# Patient Record
Sex: Male | Born: 1940 | Hispanic: No | Marital: Married | Smoking: Former smoker
Health system: Southern US, Community
[De-identification: ages and names within clinical notes are randomized; demographics above are authoritative.]

## PROBLEM LIST (undated history)

## (undated) DIAGNOSIS — H3530 Unspecified macular degeneration: Secondary | ICD-10-CM

## (undated) DIAGNOSIS — K219 Gastro-esophageal reflux disease without esophagitis: Secondary | ICD-10-CM

## (undated) DIAGNOSIS — E785 Hyperlipidemia, unspecified: Secondary | ICD-10-CM

## (undated) HISTORY — DX: Hyperlipidemia, unspecified: E78.5

## (undated) HISTORY — DX: Gastro-esophageal reflux disease without esophagitis: K21.9

## (undated) HISTORY — DX: Unspecified macular degeneration: H35.30

---

## 2007-01-30 ENCOUNTER — Ambulatory Visit: Payer: Self-pay | Admitting: Ophthalmology

## 2010-01-18 ENCOUNTER — Emergency Department: Payer: Self-pay | Admitting: Unknown Physician Specialty

## 2011-02-27 DIAGNOSIS — M5430 Sciatica, unspecified side: Secondary | ICD-10-CM

## 2011-02-27 HISTORY — DX: Sciatica, unspecified side: M54.30

## 2012-05-01 ENCOUNTER — Ambulatory Visit: Payer: Self-pay | Admitting: Ophthalmology

## 2012-05-20 ENCOUNTER — Ambulatory Visit: Payer: Self-pay | Admitting: Vascular Surgery

## 2012-06-03 ENCOUNTER — Inpatient Hospital Stay: Payer: Self-pay | Admitting: Vascular Surgery

## 2012-06-03 LAB — CREATININE, SERUM
Creatinine: 1 mg/dL
EGFR (African American): >60
EGFR (Non-African Amer.): >60

## 2012-06-03 LAB — BUN: BUN: 16 mg/dL (ref 7–18)

## 2012-06-04 LAB — BASIC METABOLIC PANEL
Anion Gap: 7 (ref 7–16)
BUN: 16 mg/dL (ref 7–18)
Co2: 25 mmol/L (ref 21–32)
Creatinine: 1.15 mg/dL (ref 0.60–1.30)
EGFR (African American): >60
Glucose: 92 mg/dL (ref 65–99)
Osmolality: 278 (ref 275–301)

## 2012-06-04 LAB — CBC WITH DIFFERENTIAL/PLATELET
Basophil %: 0.5 %
Eosinophil %: 1 %
HCT: 38.8 % — ABNORMAL LOW (ref 40.0–52.0)
HGB: 13.4 g/dL (ref 13.0–18.0)
MCH: 32.3 pg (ref 26.0–34.0)
MCV: 94 fL (ref 80–100)
Monocyte #: 1.2 x10 3/mm — ABNORMAL HIGH (ref 0.2–1.0)
Monocyte %: 11.7 %
Neutrophil #: 6.5 10*3/uL (ref 1.4–6.5)
Neutrophil %: 64.2 %
RBC: 4.15 10*6/uL — ABNORMAL LOW (ref 4.40–5.90)
WBC: 10.1 10*3/uL (ref 3.8–10.6)

## 2012-06-04 LAB — APTT: Activated PTT: 28.3 secs (ref 23.6–35.9)

## 2012-06-05 HISTORY — PX: CAROTID STENT: SHX1301

## 2013-08-04 DIAGNOSIS — I6522 Occlusion and stenosis of left carotid artery: Secondary | ICD-10-CM | POA: Insufficient documentation

## 2014-09-22 NOTE — Op Note (Signed)
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   POSTOPERATIVE DIAGNOSIS: High-grade left carotid artery stenosis was amaurosis fugax.   PROCEDURES: 1. Ultrasound guidance for vascular access, right femoral artery.  2. Catheter placement into left carotid artery from right femoral approach.  3. Placement of a left carotid artery stent with use of embolic protection to the left carotid artery.  4. StarClose closure device, right femoral artery.   SURGEON: Annice Needy, MD  ANESTHESIA: Local with moderate conscious sedation.   ESTIMATED BLOOD LOSS: Approximately 50 mL.  CONTRAST USED: 85 mL Visipaque.   INDICATION FOR PROCEDURE: This is a gentleman who was referred to my office for carotid disease and visual symptoms with amaurosis fugax several weeks ago. He is brought in for treatment. His lesion is a straight noncalcified lesion which is favorable for carotid artery stenting. The distal internal carotid artery is reasonably small and may preclude shunt placement with open surgery and so stenting is preferred. Risks and benefits were discussed and informed consent was obtained.   DESCRIPTION OF PROCEDURE: The patient was brought to the vascular interventional radiology suite, groins were shaved and prepped and a sterile surgical field was created. The right femoral head was localized with fluoroscopy and the right femoral artery was visualized with ultrasound and found to widely patent. It was then accessed under direct ultrasound guidance without difficulty with a Seldinger needle. J-wire and 6 French sheath were placed. The patient was systemically heparinized with a total of 8000 units of intravenous heparin, after he was originally subtherapeutic with 5000 units of intravenous heparin. Pigtail catheter was placed in the aorta at the ascending  aorta and LAO projection aortogram was performed. This showed normal origins of the great vessels. I then cannulated the left common carotid artery with a JB-2 catheter without difficulty and advanced into the common carotid artery. Selective imaging was then performed. This demonstrated an approximately 95% stenosis of the left internal carotid artery about 2.5 cm beyond the origin of the internal carotid artery. There was an ulcer with a more mild stenosis at the bifurcation itself. I desired to cover both of these lesions. I advanced a JB-2 catheter into the external carotid artery, placed an Amplatz super stiff wire and then placed a shuttle sheath. Intracerebral imaging showed no anterior cerebral artery and very poor flow through the middle cerebral artery on initial imaging. I was able to cross the lesion without difficulty with a NAV-6 embolic protection device and parked this in the distal main internal carotid artery.  A 9 x 7 x 4 centimeter long tapered stent was brought onto the field and encompassed the lesion. This did not entirely cover the proximal portion of the lesion, although it covered the most significant area of stenosis well and so a 9 mm diameter x 2 centimeter in length extension stent was placed proximally, into the common carotid artery. There was still a waste so a 5 mm balloon was used to iron this out and treated this with atropine and no significant hypotension or bradycardia was seen. Completion angiogram now showed this area to be widely patent with maybe a 10 to 15% residual waste present at worse. There was excellent flow through the stent. There was markedly improved flow through the middle cerebral artery, although there was still no significant anterior cerebral filling on our images. The sheath  was then removed and a StarClose closure device deployed in the usual fashion with excellent hemostatic result. The patient tolerated the procedure well and was taken to the recovery  room in stable condition. ____________________________ Annice NeedyJason S. Merilyn Pagan, MD jsd:sb D: 06/03/2012 09:53:09 ET T: 06/03/2012 10:47:08 ET JOB#: [id number]  cc: Annice NeedyJason S. Gregoire Bennis, MD, <Dictator> Annice NeedyJASON S Krishna Dancel MD ELECTRONICALLY SIGNED 06/03/2012 12:33

## 2016-11-23 DIAGNOSIS — Z961 Presence of intraocular lens: Secondary | ICD-10-CM | POA: Insufficient documentation

## 2016-11-23 DIAGNOSIS — H353131 Nonexudative age-related macular degeneration, bilateral, early dry stage: Secondary | ICD-10-CM | POA: Insufficient documentation

## 2018-03-04 ENCOUNTER — Ambulatory Visit: Payer: Medicare Other | Admitting: Family Medicine

## 2018-03-04 ENCOUNTER — Encounter: Payer: Self-pay | Admitting: Family Medicine

## 2018-03-04 VITALS — BP 130/82 | HR 90 | Temp 98.3°F | Ht 67.0 in | Wt 166.2 lb

## 2018-03-04 DIAGNOSIS — Z23 Encounter for immunization: Secondary | ICD-10-CM

## 2018-03-04 DIAGNOSIS — I6522 Occlusion and stenosis of left carotid artery: Secondary | ICD-10-CM

## 2018-03-04 DIAGNOSIS — M129 Arthropathy, unspecified: Secondary | ICD-10-CM | POA: Insufficient documentation

## 2018-03-04 DIAGNOSIS — Z79899 Other long term (current) drug therapy: Secondary | ICD-10-CM | POA: Insufficient documentation

## 2018-03-04 DIAGNOSIS — N400 Enlarged prostate without lower urinary tract symptoms: Secondary | ICD-10-CM | POA: Insufficient documentation

## 2018-03-04 DIAGNOSIS — Z5181 Encounter for therapeutic drug level monitoring: Secondary | ICD-10-CM

## 2018-03-04 DIAGNOSIS — H353131 Nonexudative age-related macular degeneration, bilateral, early dry stage: Secondary | ICD-10-CM

## 2018-03-04 DIAGNOSIS — E785 Hyperlipidemia, unspecified: Secondary | ICD-10-CM | POA: Insufficient documentation

## 2018-03-04 NOTE — Assessment & Plan Note (Signed)
Check CBC, cmp 

## 2018-03-04 NOTE — Patient Instructions (Signed)
Let's get labs today If you have not heard anything from my staff in a week about any orders/referrals/studies from today, please contact us here to follow-up [phone] Try to limit saturated fats in your diet (bologna, hot dogs, barbeque, cheeseburgers, hamburgers, steak, bacon, sausage, cheese, etc.) and get more fresh fruits, vegetables, and whole grains

## 2018-03-04 NOTE — Assessment & Plan Note (Signed)
Managed by ophthalmologist 

## 2018-03-04 NOTE — Progress Notes (Signed)
BP 130/82   Pulse 90   Temp 98.3 F (36.8 C)   Ht 5\' 7"  (1.702 m)   Wt 166 lb 3.2 oz (75.4 kg)   SpO2 99%   BMI 26.03 kg/m    Subjective:    Patient ID: Zachary Walker, male    DOB: August 03, 1940, 78 y.o.   MRN: [id number]  HPI: Zachary Walker is a 77 y.o. male  Chief Complaint  Patient presents with  . New Patient (Initial Visit)    HPI He is here to establish care No medical excitement recently; was with Dr. Peggye Walker sporadically, moved from office to office  He was watcing TV one night and he just went blind; he walked into his bathroom and went down on his knees and then raised up and his vision came back; he had plaque that popped off; went to his eye instead of his brain Last scan of the carotid was 2-3 years ago; he used to be on Plavix and some other and then his doctor told him he looked good, "I wouldn't take that"; he has not taken any for years Exercise 3 days a week No known family hx of carotid artery disease or high cholesterol; he does recall one brother who had a single bypass and one triple bypass in his heart; "that was his fault"; he eats sausage biscuits  High cholesterol; was checked a few years ago; he is a good eater; not much cheese; few eggs; not taking a statin  Dry macular degeneration; seeing doctor at [facility]  Would like to avoid any medicines if possible, willing to take if needed though  Depression screen Zachary Walker 2/9 03/04/2018 03/04/2018  Decreased Interest 0 0  Down, Depressed, Hopeless 0 0  PHQ - 2 Score 0 0  Altered sleeping 0 -  Tired, decreased energy 0 -  Change in appetite 0 -  Feeling bad or failure about yourself  0 -  Trouble concentrating 0 -  Moving slowly or fidgety/restless 0 -  Suicidal thoughts 0 -  PHQ-9 Score 0 -   Fall Risk  03/04/2018  Falls in the past year? No    Relevant past medical, surgical, family and social history reviewed History reviewed. No pertinent past medical history.   Past Surgical History:    Procedure Laterality Date  . CAROTID STENT Left 2014   Family History  Problem Relation Age of Onset  . Macular degeneration Mother   . Emphysema Mother   . Heart disease Brother    Social History   Tobacco Use  . Smoking status: Former Smoker    Types: Cigarettes    Last attempt to quit: 1978    Years since quitting: 41.7  . Smokeless tobacco: Former Neurosurgeon    Types: Chew    Quit date: 80  . Tobacco comment: un aware of how many or how long he smoked  Substance Use Topics  . Alcohol use: Yes    Alcohol/week: 1.0 standard drinks    Types: 1 Cans of beer per week  . Drug use: Never     Office Visit from 03/04/2018 in [facility]  AUDIT-C Score  1      Interim medical history since last visit reviewed. Allergies and medications reviewed  Review of Systems Per HPI unless specifically indicated above     Objective:    BP 130/82   Pulse 90   Temp 98.3 F (36.8 C)   Ht 5\' 7"  (1.702 m)  Wt 166 lb 3.2 oz (75.4 kg)   SpO2 99%   BMI 26.03 kg/m   Wt Readings from Last 3 Encounters:  03/04/18 166 lb 3.2 oz (75.4 kg)    Physical Exam  Constitutional: He appears well-developed and well-nourished. No distress.  HENT:  Head: Normocephalic and atraumatic.  Eyes: EOM are normal. No scleral icterus.  Neck: Carotid bruit is not present. No thyromegaly present.  Cardiovascular: Normal rate and regular rhythm.  Pulmonary/Chest: Effort normal and breath sounds normal.  Abdominal: Soft. Bowel sounds are normal. He exhibits no distension.  Musculoskeletal: He exhibits no edema.  Neurological: Coordination normal.  Skin: Skin is warm and dry. No pallor.  Psychiatric: He has a normal mood and affect. His behavior is normal. Judgment and thought content normal.    No results found for this or any previous visit.    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    Discussed PSA, he would like to check this      Relevant Orders   PSA     Other   Nonexudative age-related macular degeneration, bilateral, early dry stage    Managed by ophthalmologist      Medication monitoring encounter    Check CBC, cmp      Relevant Orders   CBC with Differential/Platelet   COMPLETE METABOLIC PANEL WITH GFR    Other Visit Diagnoses    Need for influenza vaccination       Relevant Orders   Flu vaccine HIGH DOSE PF (Fluzone High dose) (Completed)   Need for pneumococcal vaccination       Relevant Orders   Pneumococcal polysaccharide vaccine 23-valent greater than or equal to 2yo subcutaneous/IM (Completed)       Follow up plan: Return in about 1 year (around 03/05/2019) for follow-up visit with Zachary Walker; Medicare visit in the next few months with Zachary Walker.  An after-visit summary was printed and given to the patient at check-out.  Please see the patient instructions which may contain other information and recommendations beyond what is mentioned above in the assessment and plan.  No orders of the defined types were placed in this encounter.   Orders Placed This Encounter  Procedures  . Flu vaccine HIGH DOSE PF (Fluzone High dose)  . Pneumococcal polysaccharide vaccine 23-valent greater than or equal to 2yo subcutaneous/IM  . CBC with Differential/Platelet  . COMPLETE METABOLIC PANEL WITH GFR  . Lipid panel  . PSA

## 2018-03-04 NOTE — Assessment & Plan Note (Signed)
Order carotid US; check lipid panel

## 2018-03-04 NOTE — Assessment & Plan Note (Signed)
Discussed PSA, he would like to check this

## 2018-03-05 LAB — CBC WITH DIFFERENTIAL/PLATELET
BASOS PCT: 0.7 %
Basophils Absolute: 42 cells/uL (ref 0–200)
Eosinophils Absolute: 30 cells/uL (ref 15–500)
Eosinophils Relative: 0.5 %
HEMATOCRIT: 41.5 % (ref 38.5–50.0)
HEMOGLOBIN: 14.3 g/dL (ref 13.2–17.1)
LYMPHS ABS: 1320 {cells}/uL (ref 850–3900)
MCH: 32.4 pg (ref 27.0–33.0)
MCHC: 34.5 g/dL (ref 32.0–36.0)
MCV: 93.9 fL (ref 80.0–100.0)
MPV: 9.5 fL (ref 7.5–12.5)
Monocytes Relative: 8.4 %
NEUTROS ABS: 4104 {cells}/uL (ref 1500–7800)
Neutrophils Relative %: 68.4 %
Platelets: 226 10*3/uL (ref 140–400)
RBC: 4.42 10*6/uL (ref 4.20–5.80)
RDW: 11.6 % (ref 11.0–15.0)
TOTAL LYMPHOCYTE: 22 %
WBC: 6 10*3/uL (ref 3.8–10.8)
WBCMIX: 504 {cells}/uL (ref 200–950)

## 2018-03-05 LAB — COMPLETE METABOLIC PANEL WITH GFR
AG Ratio: 1.5 (calc) (ref 1.0–2.5)
ALBUMIN MSPROF: 4.3 g/dL (ref 3.6–5.1)
ALT: 7 U/L — ABNORMAL LOW (ref 9–46)
AST: 16 U/L (ref 10–35)
Alkaline phosphatase (APISO): 70 U/L (ref 40–115)
BILIRUBIN TOTAL: 0.6 mg/dL (ref 0.2–1.2)
BUN: 19 mg/dL (ref 7–25)
CALCIUM: 8.9 mg/dL (ref 8.6–10.3)
CO2: 25 mmol/L (ref 20–32)
Chloride: 104 mmol/L (ref 98–110)
Creat: 1.02 mg/dL (ref 0.70–1.18)
GFR, EST AFRICAN AMERICAN: 82 mL/min/{1.73_m2} (ref >=60)
GFR, EST NON AFRICAN AMERICAN: 71 mL/min/{1.73_m2} (ref >=60)
Globulin: 2.9 g/dL (calc) (ref 1.9–3.7)
Glucose, Bld: 114 mg/dL (ref 65–139)
POTASSIUM: 4.3 mmol/L (ref 3.5–5.3)
Sodium: 139 mmol/L (ref 135–146)
TOTAL PROTEIN: 7.2 g/dL (ref 6.1–8.1)

## 2018-03-05 LAB — LIPID PANEL
Cholesterol: 200 mg/dL — ABNORMAL HIGH (ref <200)
HDL: 38 mg/dL — ABNORMAL LOW (ref >40)
LDL Cholesterol (Calc): 123 mg/dL (calc) — ABNORMAL HIGH
NON-HDL CHOLESTEROL (CALC): 162 mg/dL — AB (ref <130)
TRIGLYCERIDES: 240 mg/dL — AB (ref <150)
Total CHOL/HDL Ratio: 5.3 (calc) — ABNORMAL HIGH (ref <5.0)

## 2018-03-05 LAB — PSA: PSA: 7.6 ng/mL — AB (ref <=4.0)

## 2018-03-06 ENCOUNTER — Other Ambulatory Visit: Payer: Self-pay | Admitting: Family Medicine

## 2018-03-06 ENCOUNTER — Encounter: Payer: Self-pay | Admitting: Family Medicine

## 2018-03-06 ENCOUNTER — Telehealth: Payer: Self-pay

## 2018-03-06 DIAGNOSIS — E785 Hyperlipidemia, unspecified: Secondary | ICD-10-CM

## 2018-03-06 DIAGNOSIS — R972 Elevated prostate specific antigen [PSA]: Secondary | ICD-10-CM | POA: Insufficient documentation

## 2018-03-06 MED ORDER — ATORVASTATIN CALCIUM 10 MG PO TABS: 10.0000 mg | ORAL_TABLET | Freq: Every day | ORAL | 1 refills | Status: DC

## 2018-03-06 NOTE — Telephone Encounter (Signed)
-----   Message from Kerman Passey, MD sent at 03/06/2018  9:17 AM EDT ----- Please let the patient know: CBC is normal Renal function is not as good as when he was 77 years old, but is typical for a 77 year-old gentleman; avoid NSAIDs, stay hydrated LDL is higher than we want; I'm going to recommend cholesterol medicine to help bring that LDL down; start med, limit saturated fats, ORDER lipid panel (fasting) to be done in six weeks, dx: hyperlipidemia PSA is elevated, so we'll recommend he get in to see urologist; please REFER for elevated PSA

## 2018-03-06 NOTE — Progress Notes (Signed)
Start statin Staff to order lipids for 6 weeks out elev psa Start to refer to urologist

## 2018-04-05 ENCOUNTER — Ambulatory Visit: Payer: Medicare Other | Admitting: Urology

## 2018-04-05 ENCOUNTER — Encounter: Payer: Self-pay | Admitting: Urology

## 2018-04-05 VITALS — BP 137/68 | HR 86 | Ht 67.5 in | Wt 168.0 lb

## 2018-04-05 DIAGNOSIS — N400 Enlarged prostate without lower urinary tract symptoms: Secondary | ICD-10-CM | POA: Diagnosis not present

## 2018-04-05 DIAGNOSIS — R972 Elevated prostate specific antigen [PSA]: Secondary | ICD-10-CM

## 2018-04-05 NOTE — Progress Notes (Signed)
04/05/2018 9:04 AM   Zachary Walker 1940/09/20 [id number]  Referring provider: Kerman Passey, MD [address] [place], [place] [address]  Chief Complaint  Patient presents with  . Elevated PSA    HPI: 77 year old male referred for evaluation of an elevated PSA.  A PSA drawn in September 2019 was elevated at 7.6.  He denies bothersome lower urinary tract symptoms.  He has nocturia x2 and occasional intermittent urinary stream.  He does have a history of an elevated PSA with his prior PCP.  He was evaluated at [facility] in [place] and elected not to pursue prostate biopsy.  Available PSA results are as follows: 07/2012  7.83 07/2013  5.27 08/2014  6.33 02/2015  6.68 02/2018  7.6   PMH: Past Medical History:  Diagnosis Date  . Hyperlipidemia     Surgical History: Past Surgical History:  Procedure Laterality Date  . CAROTID STENT Left 2014    Home Medications:  Allergies as of 04/05/2018   No Known Allergies     Medication List        Accurate as of 04/05/18  9:04 AM. Always use your most recent med list.          acetaminophen 650 MG CR tablet Commonly known as:  TYLENOL Take 1,300 mg by mouth every 12 (twelve) hours as needed for pain.   atorvastatin 10 MG tablet Commonly known as:  LIPITOR Take 1 tablet (10 mg total) by mouth at bedtime.   multivitamin tablet Take 1 tablet by mouth daily.   PRESERVISION AREDS Caps One by mouth twice a day       Allergies: No Known Allergies  Family History: Family History  Problem Relation Age of Onset  . Macular degeneration Mother   . Emphysema Mother   . Heart disease Brother     Social History:  reports that he quit smoking about 41 years ago. His smoking use included cigarettes. He quit smokeless tobacco use about 41 years ago.  His smokeless tobacco use included chew. He reports that he drinks about 1.0 standard drinks of alcohol per week. He reports that he does not use  drugs.  ROS: UROLOGY Frequent Urination?: No Hard to postpone urination?: No Burning/pain with urination?: No Get up at night to urinate?: Yes Leakage of urine?: No Urine stream starts and stops?: Yes Trouble starting stream?: No Do you have to strain to urinate?: No Blood in urine?: No Urinary tract infection?: No Sexually transmitted disease?: No Injury to kidneys or bladder?: No Painful intercourse?: No Weak stream?: No Erection problems?: No Penile pain?: No  Gastrointestinal Nausea?: No Vomiting?: No Indigestion/heartburn?: No Diarrhea?: No Constipation?: No  Constitutional Fever: No Night sweats?: No Weight loss?: No Fatigue?: No  Skin Skin rash/lesions?: No Itching?: No  Eyes Blurred vision?: No Double vision?: No  Ears/Nose/Throat Sore throat?: No Sinus problems?: No  Hematologic/Lymphatic Swollen glands?: No Easy bruising?: No  Cardiovascular Leg swelling?: No Chest pain?: No  Respiratory Cough?: No Shortness of breath?: No  Endocrine Excessive thirst?: No  Musculoskeletal Back pain?: Yes Joint pain?: No  Neurological Headaches?: No Dizziness?: No  Psychologic Depression?: No Anxiety?: No  Physical Exam: BP 137/68 (BP Location: Left Arm, Patient Position: Sitting, Cuff Size: Normal)   Pulse 86   Ht 5' 7.5" (1.715 m)   Wt 168 lb (76.2 kg)   BMI 25.92 kg/m   Constitutional:  Alert and oriented, No acute distress. HEENT: [place] AT, moist mucus membranes.  Trachea  midline, no masses. Cardiovascular: No clubbing, cyanosis, or edema. Respiratory: Normal respiratory effort, no increased work of breathing. GI: Abdomen is soft, nontender, nondistended, no abdominal masses GU: No CVA tenderness.  Prostate 45-50 g, smooth without nodules Lymph: No cervical or inguinal lymphadenopathy. Skin: No rashes, bruises or suspicious lesions. Neurologic: Grossly intact, no focal deficits, moving all 4 extremities. Psychiatric: Normal mood and  affect.   Assessment & Plan:   Although PSA is a prostate cancer screening test he was informed that cancer is not the most common cause of an elevated PSA. Other potential causes including BPH and inflammation were discussed. He was informed that the only way to adequately diagnose prostate cancer would be a transrectal ultrasound and biopsy of the prostate. The procedure was discussed including potential risks of bleeding and infection/sepsis. He was also informed that a negative biopsy does not conclusively rule out the possibility that prostate cancer may be present and that continued monitoring is required. The use of newer adjunctive blood tests including PHI and 4kScore were discussed.  The American Urological Association does not recommend PSA/prostate cancer screening for patients greater than age 17 and we discussed discontinuing prostate cancer screening.  His PSA has been elevated since at least 2014 and is currently stable.  The option of continued periodic monitoring was also discussed.  He would like to continue periodic monitoring and would prefer to have his PSA checked next year by Dr. Sherie Don. Follow-up as needed for any significant PSA change.    Riki Altes, MD  [facility] [address], [address] [place], [place] [address] [phone]

## 2018-04-28 ENCOUNTER — Other Ambulatory Visit: Payer: Self-pay | Admitting: Family Medicine

## 2018-04-29 NOTE — Telephone Encounter (Signed)
Please remind patient to have labs done ASAP He should not be out of medicine yet, so we'll want to see if we refill this same strength (10 mg) or if we need to increase it Thank you

## 2018-04-29 NOTE — Telephone Encounter (Signed)
Pt.notified

## 2018-05-07 ENCOUNTER — Other Ambulatory Visit: Payer: Self-pay | Admitting: Family Medicine

## 2018-05-07 ENCOUNTER — Telehealth: Payer: Self-pay

## 2018-05-07 DIAGNOSIS — E785 Hyperlipidemia, unspecified: Secondary | ICD-10-CM

## 2018-05-07 LAB — LIPID PANEL
CHOLESTEROL: 147 mg/dL (ref <200)
HDL: 38 mg/dL — ABNORMAL LOW (ref >40)
LDL CHOLESTEROL (CALC): 77 mg/dL
Non-HDL Cholesterol (Calc): 109 mg/dL (calc) (ref <130)
Total CHOL/HDL Ratio: 3.9 (calc) (ref <5.0)
Triglycerides: 232 mg/dL — ABNORMAL HIGH (ref <150)

## 2018-05-07 MED ORDER — ATORVASTATIN CALCIUM 20 MG PO TABS: 20.0000 mg | ORAL_TABLET | Freq: Every day | ORAL | 1 refills | Status: DC

## 2018-05-07 NOTE — Telephone Encounter (Signed)
Zachary BradfordKimberly, please let the patient know that his cholesterol has really improved His total cholesterol dropped from 200 to 147 and his LDL cholesterol (the "bad" cholesterol) came down from 123 to 77; that's great progress! We just have a little further to go to get him to goal; let's increase his atorvastatin from 10 mg to 20 mg daily and recheck his fasting lipids in 6-8 weeks (please ORDER, lipids, dx carotid athero, hyperlipidemia) Thank you

## 2018-05-07 NOTE — Progress Notes (Signed)
Increase statin

## 2018-05-07 NOTE — Telephone Encounter (Signed)
-----   Message from Kerman PasseyMelinda P Lada, MD sent at 05/07/2018  7:46 AM EST ----- Cala BradfordKimberly, please let the patient know that his cholesterol has really improved His total cholesterol dropped from 200 to 147 and his LDL cholesterol (the "bad" cholesterol) came down from 123 to 77; that's great progress! We just have a little further to go to get him to goal; let's increase his atorvastatin from 10 mg to 20 mg daily and recheck his fasting lipids in 6-8 weeks (please ORDER, lipids, dx carotid athero, hyperlipidemia) Thank you

## 2018-05-21 ENCOUNTER — Other Ambulatory Visit: Payer: Self-pay | Admitting: Family Medicine

## 2018-05-22 NOTE — Telephone Encounter (Signed)
I just approved 30+1 more than 2 weeks ago, which should last until he gets his labs done (6-8 weeks after starting new dose) Please resolve with pharmacy

## 2018-05-23 ENCOUNTER — Other Ambulatory Visit: Payer: Self-pay | Admitting: Family Medicine

## 2018-05-23 NOTE — Telephone Encounter (Signed)
Ins requesting 90 day 

## 2018-08-15 ENCOUNTER — Ambulatory Visit: Payer: Medicare Other

## 2018-08-21 ENCOUNTER — Telehealth: Payer: Self-pay | Admitting: Family Medicine

## 2018-08-21 NOTE — Telephone Encounter (Signed)
Copied from CRM [id number]. Topic: General - Other >> Aug 21, 2018  1:24 PM Tamela Oddi wrote: Reason for CRM: Patient called to leave a message for Dr. Sherie Don.  Patient would like a letter stating that he is in good health.  Patient stated that he needs this letter for a study that he is in at [facility].  Please advise and call back at [phone]

## 2018-08-22 MED ORDER — ASPIRIN EC 81 MG PO TBEC: 81.0000 mg | DELAYED_RELEASE_TABLET | Freq: Every day | ORAL | Status: AC

## 2018-08-22 NOTE — Telephone Encounter (Signed)
appt made for Monday (elizabeth first availability). I did offer emily for tomorrow but he wanted to stick with who you suggested.

## 2018-08-22 NOTE — Telephone Encounter (Signed)
Patient calling back requesting to speak with Dr. Sherie Don or CMA.

## 2018-08-22 NOTE — Telephone Encounter (Addendum)
Please let him know that I also think the world of Zachary Walker and would prefer him see first available

## 2018-08-22 NOTE — Telephone Encounter (Signed)
I called patient He's in a study at [facility], 4 weeks now for macular degeneration He thinks that he had an anxiety attack the other night Wife called rescue squad, they checked him out, feeling good He did not go to the hospital to get checked out He went to bed around 8:30 or 8:45 and couldn't get his breath; did that a few times, couldn't get his breath He panicked and got scared, got up out of bed, told his wife They just checked his vital signs, according to his vital signs, they diagnosed him with an anxiety attack It was left up to him to go to the hospital They did an EKG he says; wife says normal too they were told He has not had an anxiety attack before Wife hopped on, oxygen was "good", BP was 150/100, pulse was "good" She thinks he had some indigestion, trying to take deep breaths His color was good; no sweating Checked him out for stroke symptoms, he was okay there I asked about sleep apnea; he does snore she says; never diagnosed with sleep apnea; they don't sleep together; she is not there to know if he gasps or catches his breath in his sleep Before I can submit a letter, I think he does need to get checked out; I cannot attest that he is "healthy" and may need a sleep study; sleep apnea can be dangerous, fatal; angina is another possibility I explained this cannot wait until next month I asked about fam hx of sleep apnea or heart attacks: mother had a heart attack around age 20 or 97 Start baby 81 mg aspirin daily while working this up; he denies any contraindications If this happens again, call 911 and let them take him to the ER; he agrees Discussed safeguards to take when he comes in to the office for infection control --------------------------------------------  Cassandra,  Please call patient and get him a first available appointment to see Lanora Manis He had "ingestion, couldn't get his breath" Will want to evaluate for possible sleep apnea, cardiac issues ASAP Thank  you

## 2018-08-23 NOTE — Telephone Encounter (Signed)
We'll have to leave him on Elizabeth's schedule then for Monday, thank you

## 2018-08-23 NOTE — Telephone Encounter (Signed)
Everyone is now completely booked. Please advise

## 2018-08-25 NOTE — Progress Notes (Signed)
Name: Zachary Walker   MRN: [id number]    DOB: 1940-07-07   Date:08/26/2018       Progress Note  Subjective  Chief Complaint  Chief Complaint  Patient presents with  . GI Problem    onset last Thursday. Patient feels extremely bloated and gassy.    HPI  Patient presents for medical clearance for a investigational trial at [facility] for macular degeneration. States he has been a part of it for 3 months and has already received one dose of the medication. States he told them that he was having shortness of breath and they wanted medical clearance for continuance.  PCP called patient to discuss medical clearance and he noted he recently had to call 911 due to shortness of breath. He went to bed an woke up due to "gas pain" had lots of burping and sharp intermittent abdominal pains- sharp and lower, and then states he was unable to catch his breath. States he has been having this for the past few weeks but was most noticeable Thursday night. He believes it was an anxiety attack related to gas pains. EKG and stroke scale were normal per patient report. States he has had increase appetite the last few weeks and abdominal bloating. Denies nausea, vomiting, diarrhea, endorses mild constipation. Last BM was yesterday. Patient states took ex-lax the day before yesterday and had large stool afterwards. Has not had any shortness of breath since the, denies chest pain, fevers, chills, no blood in stools.   Results of the Epworth flowsheet 08/26/2018  Sitting and reading 1  Watching TV 1  Sitting, inactive in a public place (e.g. a theatre or a meeting) 0  As a passenger in a car for an hour without a break 0  Lying down to rest in the afternoon when circumstances permit 1  Sitting and talking to someone 0  Sitting quietly after a lunch without alcohol 0  In a car, while stopped for a few minutes in traffic 0  Total score 3     PHQ2/9: Depression screen [facility] 2/9 08/26/2018 03/04/2018 03/04/2018  Decreased  Interest 0 0 0  Down, Depressed, Hopeless 0 0 0  PHQ - 2 Score 0 0 0  Altered sleeping 0 0 -  Tired, decreased energy 0 0 -  Change in appetite 0 0 -  Feeling bad or failure about yourself  0 0 -  Trouble concentrating 0 0 -  Moving slowly or fidgety/restless 0 0 -  Suicidal thoughts 0 0 -  PHQ-9 Score 0 0 -  Difficult doing work/chores Not difficult at all - -     PHQ reviewed. Negative  Patient Active Problem List   Diagnosis Date Noted  . Hyperlipidemia LDL goal <70 03/06/2018  . Elevated PSA 03/06/2018  . Other and unspecified hyperlipidemia 03/04/2018  . 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 Arthropathy, unspecified, other specified sites 03/04/2018  . Medication monitoring encounter 03/04/2018  . Nonexudative age-related macular degeneration, bilateral, early dry stage 11/23/2016  . 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 Left carotid stenosis 08/04/2013  . Sciatica 02/27/2011    Past Medical History:  Diagnosis Date  . Hyperlipidemia     Past Surgical History:  Procedure Laterality Date  . CAROTID STENT Left 2014    Social History   Tobacco Use  . Smoking status: Former Smoker    Types: Cigarettes    Last attempt to quit: 1978    Years since quitting:  42.2  . Smokeless tobacco: Former NeurosurgeonUser    Types: Chew    Quit date: 241978  . Tobacco comment: un aware of how many or how long he smoked  Substance Use Topics  . Alcohol use: Yes    Alcohol/week: 1.0 standard drinks    Types: 1 Cans of beer per week    Comment: 5 a month     Current Outpatient Medications:  .  acetaminophen (TYLENOL) 650 MG CR tablet, Take 1,300 mg by mouth every 12 (twelve) hours as needed for pain., Disp: , Rfl:  .  aspirin EC 81 MG tablet, Take 1 tablet (81 mg total) by mouth daily., Disp: , Rfl:  .  atorvastatin (LIPITOR) 20 MG tablet, TAKE 1 TABLET BY MOUTH EVERYDAY AT BEDTIME, Disp: 90 tablet, Rfl: 0 .  Multiple Vitamin  (MULTIVITAMIN) tablet, Take 1 tablet by mouth daily., Disp: , Rfl:  .  Multiple Vitamins-Minerals (PRESERVISION AREDS) CAPS, One by mouth twice a day, Disp: , Rfl: 0  No Known Allergies  ROS   No other specific complaints in a complete review of systems (except as listed in HPI above).  Objective  Vitals:   08/26/18 0819  BP: 114/76  Pulse: 62  Resp: 14  Temp: 97.7 F (36.5 C)  SpO2: 97%  Weight: 174 lb 11.2 oz (79.2 kg)  Height: 5\' 7"  (1.702 m)    Body mass index is 27.36 kg/m.  Nursing Note and Vital Signs reviewed.  Physical Exam Vitals signs reviewed.  Constitutional:      Appearance: He is well-developed.  HENT:     Head: Normocephalic and atraumatic.     Mouth/Throat:     Mouth: Mucous membranes are moist.     Pharynx: Oropharynx is clear. No posterior oropharyngeal erythema.  Eyes:     Conjunctiva/sclera: Conjunctivae normal.  Neck:     Musculoskeletal: Normal range of motion and neck supple.     Vascular: No carotid bruit.  Cardiovascular:     Heart sounds: Normal heart sounds.  Pulmonary:     Effort: Pulmonary effort is normal.     Breath sounds: Normal breath sounds.  Abdominal:     General: Bowel sounds are increased.     Palpations: Abdomen is soft.     Tenderness: There is no abdominal tenderness. There is no right CVA tenderness or left CVA tenderness.  Musculoskeletal: Normal range of motion.  Skin:    General: Skin is warm and dry.     Capillary Refill: Capillary refill takes less than 2 seconds.  Neurological:     Mental Status: He is alert and oriented to person, place, and time.     GCS: GCS eye subscore is 4. GCS verbal subscore is 5. GCS motor subscore is 6.     Sensory: No sensory deficit.  Psychiatric:        Speech: Speech normal.        Behavior: Behavior normal.        Thought Content: Thought content normal.        Judgment: Judgment normal.      No results found for this or any previous visit (from the past 48  hour(s)).  Assessment & Plan  1. Generalized abdominal pain - EKG 12-Lead: Normal sinus rhythm, without ectopy  - CBC - COMPLETE METABOLIC PANEL WITH GFR - omeprazole (PRILOSEC) 40 MG capsule; Take 1 capsule (40 mg total) by mouth daily.  Dispense: 90 capsule; Refill: 1 - H. pylori breath test  2.  Belching Take peptobismal at night for the next week, LOW FODMAP diet, if unimproved in 1-2 weeks let us know for follow-up and change of plan, consider EGD, refer to GI - omeprazole (PRILOSEC) 40 MG capsule; Take 1 capsule (40 mg total) by mouth daily.  Dispense: 90 capsule; Refill: 1 - H. pylori breath test   -Red flags and when to present for emergency care or RTC including fever >101.61F, chest pain, shortness of breath,severe pain, difficulty breathing, fevers, vomiting/diarrhea or inability to have a bowel movement with severe pain reviewed with patient at time of visit. Follow up and care instructions discussed and provided in AVS.

## 2018-08-26 ENCOUNTER — Other Ambulatory Visit: Payer: Self-pay

## 2018-08-26 ENCOUNTER — Encounter: Payer: Self-pay | Admitting: Nurse Practitioner

## 2018-08-26 ENCOUNTER — Ambulatory Visit (INDEPENDENT_AMBULATORY_CARE_PROVIDER_SITE_OTHER): Payer: Medicare Other | Admitting: Nurse Practitioner

## 2018-08-26 ENCOUNTER — Other Ambulatory Visit: Payer: Self-pay | Admitting: Family Medicine

## 2018-08-26 VITALS — BP 114/76 | HR 62 | Temp 97.7°F | Resp 14 | Ht 67.0 in | Wt 174.7 lb

## 2018-08-26 DIAGNOSIS — R142 Eructation: Secondary | ICD-10-CM | POA: Diagnosis not present

## 2018-08-26 DIAGNOSIS — R1084 Generalized abdominal pain: Secondary | ICD-10-CM | POA: Diagnosis not present

## 2018-08-26 MED ORDER — OMEPRAZOLE 40 MG PO CPDR: 40.0000 mg | DELAYED_RELEASE_CAPSULE | Freq: Every day | ORAL | 1 refills | Status: DC

## 2018-08-26 NOTE — Patient Instructions (Addendum)
- Avoid gas-producing foods (eg, cabbage, onions, broccoli, brussel sprouts, wheat, and potatoes. Look up LOW FODMAP diet.  - Please take pepto as directed on bottle before bedtime -Take omeprazole once daily with a full glass of water.  - If you have improvement in 1-2 weeks, great keep doing what your doing. If there is no relief please let us know we may want to do an endoscopy.  - If you develop severe pain, difficulty breathing, fevers, vomiting/diarrhea or inability to have a bowel movement with severe pain please get immediate medical attention   FOR Constipation:  Always:  - Increase water intake - Increase activity as tolerated.  _______________ Can Do always or sometimes:   - Metamucil Powder; 1 teaspon start once a day for 3 days then twice a day for 3 days then can go up to 3 times a day if needed.  - Take docusate sodium 100 mg twice a day  ________  Rarely just as needed; Please follow direction on box:   - Take Polyethylene glycol 17 grams per day  OR  - Sennosides 8.6 mg: 2 tablets orally once a day at bedtime   _______ If unable to have Bowel movement, having abdominal pain, or nausea and vomiting please get medical assistance immediately.   What causes gas?-Causes of gas include: ?Swallowing air, often while eating, drinking, or smoking. Swallowed air usually comes back out as a burp. ?Eating certain foods, such as beans, broccoli, fruit, wheat, potatoes, corn, and noodles. Bacteria in the intestines digest parts of these foods and spit out gas. ?Trouble digesting certain foods, such as wheat or dairy products ?Conditions that harm the digestive system What causes bloating?-The colon has a lot of bends in it . When air gets trapped in these bends, you might feel cramps or sharp pains. This pain is common in the middle and top of the belly on either side. How much gas is normal?-Most people pass gas 14 to 23 times each day. Burping before and after meals is  also common. Gas bothers some people more than others. Why does my gas smell?-Most of the gas that comes out of your anus has no smell. But some of it contains a substance called sulfur. Sulfur smells bad to most people. Is there anything I can do on my own to get rid of gas and bloating?-You might feel better if you: ?Cut down on certain foods. Write down what you eat so you can figure out which foods are causing your gas. Everyone's body is different. Common causes of gas are: .Milk and dairy products .Beans .Some vegetables, such as cabbage, Brussels sprouts, asparagus, broccoli, potatoes, and corn .Some whole grains, such as wheat .Most types of fruit .Artificial sweeteners .Soda and other fizzy drinks .Chewing gum ?Take medicines that contain simethicone  (sample brand names: Maalox Anti-Gas, Mylanta Gas, Gas-X, or Phazyme). You can get these at a drug store. Simethicone breaks up gas bubbles in your intestines. Doctors aren't sure how well it works. ?Take a product called beano. This can help your body digest beans and some vegetables. ?Take a medicine called bistmuthysulfate (brand name: Pepto-Bismol). This can help make gas smell less bad. Should I see a doctor or nurse?-See your doctor or nurse if you also have any of these symptoms:  ?Diarrhea ?Unexplained weight loss ?Belly pain ?Blood in bowel movements ?Loss of appetite ?Unexplained fever ?Throwing up Are there tests I should have?-Your doctor or nurse will decide which tests you should have based  on your age, other symptoms, and individual situation. There are lots of tests, but you might not need any. Here are the most common tests doctors use to find the cause of gas and bloating: ?Tests on a sample of your bowel movements to check for blood, unusual levels of fat, and other things ?Blood test to see if your body has trouble digesting an ingredient called gluten. Gluten is in bread, pasta, condiments, and other  foods. ?Breath test to see if your body has trouble digesting dairy products or if you have an over-growth of bacteria in your intestines. ?X-rays to see if there is something wrong with your intestines ?Upper endoscopy or colonoscopy - For these tests, the doctor puts a thin tube into your stomach or colon. The tube has a camera attached to it, so the doctor can see inside you. The doctor can also take samples of tissue to look at under the microscope. How is gas and bloating treated?-That depends on what is causing your gas and bloating. Treatments can include: ?Changing what you eat and drink ?Changing how you eat and drink. Eating more slowly can help with burping. ?Using supplements to help you digest dairy products ?Taking medicines that you can buy at a drug store ?Taking medicines that your doctor prescribes Can gas be prevented?-You can reduce your chances of getting gas again by: ?Staying away from foods and drinks that cause gas for you ?Taking Beano when you eat beans and some vegetables ?Taking supplements that help you digest dairy, if that is your problem ?Eating more slowly

## 2018-08-27 ENCOUNTER — Other Ambulatory Visit: Payer: Self-pay | Admitting: Nurse Practitioner

## 2018-08-27 DIAGNOSIS — R1084 Generalized abdominal pain: Secondary | ICD-10-CM

## 2018-08-27 DIAGNOSIS — A048 Other specified bacterial intestinal infections: Secondary | ICD-10-CM

## 2018-08-27 DIAGNOSIS — R142 Eructation: Secondary | ICD-10-CM

## 2018-08-27 LAB — CBC
HCT: 43.4 % (ref 38.5–50.0)
Hemoglobin: 15 g/dL (ref 13.2–17.1)
MCH: 32.3 pg (ref 27.0–33.0)
MCHC: 34.6 g/dL (ref 32.0–36.0)
MCV: 93.5 fL (ref 80.0–100.0)
MPV: 10.1 fL (ref 7.5–12.5)
Platelets: 254 10*3/uL (ref 140–400)
RBC: 4.64 10*6/uL (ref 4.20–5.80)
RDW: 11.8 % (ref 11.0–15.0)
WBC: 6.7 10*3/uL (ref 3.8–10.8)

## 2018-08-27 LAB — COMPLETE METABOLIC PANEL WITH GFR
AG Ratio: 1.5 (calc) (ref 1.0–2.5)
ALT: 11 U/L (ref 9–46)
AST: 14 U/L (ref 10–35)
Albumin: 4.6 g/dL (ref 3.6–5.1)
Alkaline phosphatase (APISO): 99 U/L (ref 35–144)
BUN: 16 mg/dL (ref 7–25)
CALCIUM: 9.5 mg/dL (ref 8.6–10.3)
CO2: 28 mmol/L (ref 20–32)
Chloride: 105 mmol/L (ref 98–110)
Creat: 1.1 mg/dL (ref 0.70–1.18)
GFR, Est African American: 75 mL/min/{1.73_m2} (ref >=60)
GFR, Est Non African American: 64 mL/min/{1.73_m2} (ref >=60)
Globulin: 3 g/dL (calc) (ref 1.9–3.7)
Glucose, Bld: 93 mg/dL (ref 65–99)
Potassium: 4.4 mmol/L (ref 3.5–5.3)
Sodium: 141 mmol/L (ref 135–146)
Total Bilirubin: 0.6 mg/dL (ref 0.2–1.2)
Total Protein: 7.6 g/dL (ref 6.1–8.1)

## 2018-08-27 LAB — H. PYLORI BREATH TEST: H. pylori Breath Test: DETECTED — AB

## 2018-08-27 MED ORDER — CLARITHROMYCIN 500 MG PO TABS: 500.0000 mg | ORAL_TABLET | Freq: Two times a day (BID) | ORAL | 0 refills | Status: AC

## 2018-08-27 MED ORDER — AMOXICILLIN 500 MG PO TABS: 1000.0000 mg | ORAL_TABLET | Freq: Two times a day (BID) | ORAL | 0 refills | Status: AC

## 2018-08-28 ENCOUNTER — Telehealth: Payer: Self-pay | Admitting: Family Medicine

## 2018-08-28 NOTE — Telephone Encounter (Signed)
Copied from CRM [id number]. Topic: Quick Communication - See Telephone Encounter >> Aug 28, 2018  2:56 PM Burchel, Abbi R wrote: CRM for notification. See Telephone encounter for: 08/28/18.  Pt states he was asked to pass along this information re: Study on Macular Degeneration.  Pt states he must have approval from his PCP to attend visit next month. Please call Dr to approve.   Dr Theone Stanley  [phone] Or [phone]

## 2018-09-03 NOTE — Telephone Encounter (Signed)
Per the request of Sharyon Cable, I contacted Dr. Oley Balm office to see if they could fax [place] some information on this study and the authorization that they are requiring, but the study coordinator has to get back with me at a later time.   Her name is Trixie Dredge   11:35am  She returned my call and stated he is already enrolled in the study. She submitted a medical release form 2 weeks ago so that she can receive his records.  He missed his appt due to him having sob, cough and fever that he mentioned to [facility] over the phone on 08/10/2018 which is why they was requesting approval to make sure he did not have COVID19. I informed her that he did not display any of those sx when he came in for an office visit on 08/26/2018.  She then asked if we can send her the records so Dr. Theone Stanley can review them.   Release ID #[id number]

## 2018-09-03 NOTE — Telephone Encounter (Signed)
Please ask patient or office to send approval paperwork. His abdominal pain is mild and likely to his h.pylori that is presently being treated. We can supply them with last note and updated med list and results as needed. I do not think this should prevent him from continuing in their study.

## 2018-09-26 ENCOUNTER — Ambulatory Visit (INDEPENDENT_AMBULATORY_CARE_PROVIDER_SITE_OTHER): Payer: Medicare Other

## 2018-09-26 DIAGNOSIS — Z0000 Encounter for general adult medical examination without abnormal findings: Secondary | ICD-10-CM

## 2018-09-26 NOTE — Progress Notes (Signed)
Subjective:   Zachary Walker is a 78 y.o. male who presents for an Initial Medicare Annual Wellness Visit.  This visit is being conducted via virtual visit due to the COVID-19 pandemic. This patient has given me verbal consent via video chat to conduct this visit. Some vital signs may be absent or patient reported.    Review of Systems   Cardiac Risk Factors include: advanced age (>9255men, 76>65 women);dyslipidemia;male gender    Objective:    There were no vitals filed for this visit. There is no height or weight on file to calculate BMI.  Advanced Directives 09/26/2018  Does Patient Have a Medical Advance Directive? Yes  Type of Advance Directive Living will;Healthcare Power of Attorney  Copy of Healthcare Power of Attorney in Chart? No - copy requested    Current Medications (verified) Outpatient Encounter Medications as of 09/26/2018  Medication Sig  . acetaminophen (TYLENOL) 650 MG CR tablet Take 1,300 mg by mouth every 12 (twelve) hours as needed for pain.  Marland Kitchen. aspirin EC 81 MG tablet Take 1 tablet (81 mg total) by mouth daily.  Marland Kitchen. atorvastatin (LIPITOR) 20 MG tablet TAKE 1 TABLET BY MOUTH AT BEDTIME  . Multiple Vitamin (MULTIVITAMIN) tablet Take 1 tablet by mouth daily.  . Multiple Vitamins-Minerals (PRESERVISION AREDS) CAPS One by mouth twice a day  . omeprazole (PRILOSEC) 40 MG capsule Take 1 capsule (40 mg total) by mouth daily.   No facility-administered encounter medications on file as of 09/26/2018.     Allergies (verified) Patient has no known allergies.   History: Past Medical History:  Diagnosis Date  . GERD (gastroesophageal reflux disease)   . Hyperlipidemia   . Macular degeneration    Past Surgical History:  Procedure Laterality Date  . CAROTID STENT Left 2014   Family History  Problem Relation Age of Onset  . Macular degeneration Mother   . Emphysema Mother   . Heart disease Brother    Social History   Socioeconomic History  . Marital status:  Married    Spouse name: Zachary Walker  . Number of children: 2  . Years of education: 6915  . Highest education level: Some college, no degree  Occupational History  . Not on file  Social Needs  . Financial resource strain: Not hard at all  . Food insecurity:    Worry: Never true    Inability: Never true  . Transportation needs:    Medical: No    Non-medical: No  Tobacco Use  . Smoking status: Former Smoker    Types: Cigarettes    Last attempt to quit: 1978    Years since quitting: 42.3  . Smokeless tobacco: Former NeurosurgeonUser    Types: Chew    Quit date: 571978  . Tobacco comment: un aware of how many or how long he smoked  Substance and Sexual Activity  . Alcohol use: Yes    Alcohol/week: 1.0 standard drinks    Types: 1 Cans of beer per week    Comment: 5 a month  . Drug use: Never  . Sexual activity: Not Currently  Lifestyle  . Physical activity:    Days per week: 7 days    Minutes per session: 50 min  . Stress: Not at all  Relationships  . Social connections:    Talks on phone: More than three times a week    Gets together: Once a week    Attends religious service: More than 4 times per year    Active  member of club or organization: No    Attends meetings of clubs or organizations: Never    Relationship status: Married  Other Topics Concern  . Not on file  Social History Narrative  . Not on file   Tobacco Counseling Counseling given: Not Answered Comment: un aware of how many or how long he smoked   Clinical Intake:  Pre-visit preparation completed: Yes  Pain : No/denies pain     Nutritional Risks: None Diabetes: No     Interpreter Needed?: No  Information entered by :: Reather Littler LPN  Activities of Daily Living In your present state of health, do you have any difficulty performing the following activities: 09/26/2018 08/26/2018  Hearing? Y N  Comment declines hearing aids -  Vision? N N  Comment wears glasses -  Difficulty concentrating or making  decisions? N N  Walking or climbing stairs? N N  Dressing or bathing? N N  Doing errands, shopping? N N  Preparing Food and eating ? N -  Using the Toilet? N -  In the past six months, have you accidently leaked urine? N -  Do you have problems with loss of bowel control? N -  Managing your Medications? N -  Managing your Finances? N -  Housekeeping or managing your Housekeeping? N -  Some recent data might be hidden     Immunizations and Health Maintenance Immunization History  Administered Date(s) Administered  . Influenza, High Dose Seasonal PF 03/20/2015, 03/04/2018  . Influenza-Unspecified 03/31/2011, 03/30/2012, 03/25/2016, 02/05/2018  . Pneumococcal Conjugate-13 08/04/2013  . Pneumococcal Polysaccharide-23 03/04/2018  . Td 08/18/2014   There are no preventive care reminders to display for this patient.  Patient Care Team: Lada, Janit Bern, MD as PCP - General (Family Medicine)  Indicate any recent Medical Services you may have received from other than [facility] providers in the past year (date may be approximate).    Assessment:   This is a routine wellness examination for Zachary Walker.  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 DIET - INCREASE WATER INTAKE     Recommend drinking 6-8 glasses of water per day      Depression Screen PHQ 2/9 Scores 09/26/2018 08/26/2018 03/04/2018 03/04/2018  PHQ - 2 Score 0 0 0 0  PHQ- 9 Score - 0 0 -    Fall Risk Fall Risk  09/26/2018 08/26/2018 03/04/2018   Falls in the past year? 0 0 No  Number falls in past yr: 0 - -  Injury with Fall? 0 - -  Follow up Falls prevention discussed - -   FALL RISK PREVENTION PERTAINING TO THE HOME:  Any stairs in or around the home? No  If so, do they handrails? No   Home free of loose throw rugs in walkways, pet beds, electrical cords, etc? Yes  Adequate lighting in your home to reduce risk of falls? Yes   ASSISTIVE DEVICES UTILIZED TO PREVENT FALLS:  Life alert? No  Use of a cane, walker or w/c? No  Grab bars in the bathroom? No  Shower chair or bench  in shower? No  Elevated toilet seat or a handicapped toilet? No   DME ORDERS:  DME order needed?  No   TIMED UP AND GO:  Was the test performed? Yes .  Length of time to ambulate 10 feet: 5 sec.   GAIT:  Appearance of gait: Gait stead-fast and without the use of an assistive device.  Education: Fall risk prevention has been discussed.  Intervention(s) required? No   Cognitive Function:     6CIT Screen 09/26/2018  What Year? 0 points  What month? 0 points  What time? 0 points  Count back from 20 0 points  Months in reverse 2 points  Repeat phrase 0 points  Total Score 2    Screening Tests Health Maintenance  Topic Date Due  . INFLUENZA VACCINE  01/04/2019  . TETANUS/TDAP  08/17/2024  . PNA vac Low Risk Adult  Completed    Qualifies for Shingles Vaccine? Yes . Due for Shingrix. Education has been provided regarding the importance of this vaccine. Pt has been advised to call insurance company to determine out of pocket expense. Advised may also receive vaccine at local pharmacy or Health Dept. Verbalized acceptance and understanding.  Tdap: Up to date  Flu Vaccine: Up to date  Pneumococcal Vaccine: Up to date   Cancer Screenings:  Colorectal Screening: No longer required.   Lung Cancer Screening: (Low Dose CT Chest recommended if Age 35-80 years, 30 pack-year currently smoking OR have quit w/in 15years.) does not  qualify.    Additional Screening:  Hepatitis C Screening: no longer required  Vision Screening: Recommended annual ophthalmology exams for early detection of glaucoma and other disorders of the eye. Is the patient up to date with their annual eye exam?  Yes  Who is the provider or what is the name of the office in which the pt attends annual eye exams? [facility]   Dental Screening: Recommended annual dental exams for proper oral hygiene  Community Resource Referral:  CRR required this visit?  No       Plan:     I have personally reviewed and addressed the Medicare Annual Wellness questionnaire and have noted the following in the patient's chart:  A. Medical and social history B. Use of alcohol, tobacco or illicit drugs  C. Current medications and supplements D. Functional ability and status E.  Nutritional status F.  Physical activity G. Advance directives H. List of other physicians I.  Hospitalizations, surgeries, and ER visits in previous 12 months J.  Vitals K. Screenings such as hearing and vision if needed, cognitive and depression L. Referrals and appointments   In addition, I have reviewed and discussed with patient certain preventive protocols, quality metrics, and best practice recommendations. A written personalized care plan for preventive services as well as general preventive health recommendations were provided to patient.   Signed,  Reather Littler, LPN Nurse Health Advisor   Nurse Notes: pt doing well and appreciative of virtual visit today

## 2018-10-01 ENCOUNTER — Ambulatory Visit: Payer: Medicare Other

## 2018-10-21 ENCOUNTER — Ambulatory Visit: Payer: Self-pay | Admitting: *Deleted

## 2018-10-21 NOTE — Telephone Encounter (Signed)
Please schedule patient for an appt tomorrow but if he has any of the sx listed by Lanora Manis then he must go to his nearest ER.

## 2018-10-21 NOTE — Telephone Encounter (Signed)
If pain becomes severe, he develops nausea, vomiting, fevers, chills, black urine please go to ER

## 2018-10-21 NOTE — Telephone Encounter (Signed)
Patient is concerned that he may have hurt himself doing strenuous yard work- he has been passing some blood clots yesterday and has had blood in his urine today.  Call to office to schedule patient an appointment.  Reason for Disposition . Blood in urine  (Exception: could be normal menstrual bleeding)  Answer Assessment - Initial Assessment Questions 1. COLOR of URINE: "Describe the color of the urine."  (e.g., tea-colored, pink, red, blood clots, bloody)     Clots yesterday- blood today- red 2. ONSET: "When did the bleeding start?"      Started a couple days ago- he thought he had strained doing yard work 3. EPISODES: "How many times has there been blood in the urine?" or "How many times today?"     3 times yesterday and once today 4. PAIN with URINATION: "Is there any pain with passing your urine?" If so, ask: "How bad is the pain?"  (Scale 1-10; or mild, moderate, severe)    - MILD - complains slightly about urination hurting    - MODERATE - interferes with normal activities      - SEVERE - excruciating, unwilling or unable to urinate because of the pain      No pian with urination 5. FEVER: "Do you have a fever?" If so, ask: "What is your temperature, how was it measured, and when did it start?"     No fever 6. ASSOCIATED SYMPTOMS: "Are you passing urine more frequently than today    no 7. OTHER SYMPTOMS: "Do you have any other symptoms?" (e.g., back/flank pain, abdominal pain, vomiting)     Some lower abdominal soreness - patient thinks he strained from yard work- patient got dehydrated yesterday working outside 8. PREGNANCY: "Is there any chance you are pregnant?" "When was your last menstrual period?"     n/a  Protocols used: URINE - BLOOD IN-A-AH

## 2018-10-22 ENCOUNTER — Encounter: Payer: Self-pay | Admitting: Nurse Practitioner

## 2018-10-22 ENCOUNTER — Other Ambulatory Visit: Payer: Self-pay

## 2018-10-22 ENCOUNTER — Ambulatory Visit: Payer: Medicare Other | Admitting: Nurse Practitioner

## 2018-10-22 VITALS — BP 130/82 | HR 91 | Temp 98.2°F | Resp 14 | Ht 67.0 in | Wt 166.1 lb

## 2018-10-22 DIAGNOSIS — N400 Enlarged prostate without lower urinary tract symptoms: Secondary | ICD-10-CM

## 2018-10-22 DIAGNOSIS — I6522 Occlusion and stenosis of left carotid artery: Secondary | ICD-10-CM | POA: Diagnosis not present

## 2018-10-22 DIAGNOSIS — Z5181 Encounter for therapeutic drug level monitoring: Secondary | ICD-10-CM

## 2018-10-22 DIAGNOSIS — R310 Gross hematuria: Secondary | ICD-10-CM | POA: Diagnosis not present

## 2018-10-22 DIAGNOSIS — E785 Hyperlipidemia, unspecified: Secondary | ICD-10-CM | POA: Diagnosis not present

## 2018-10-22 DIAGNOSIS — N3090 Cystitis, unspecified without hematuria: Secondary | ICD-10-CM

## 2018-10-22 LAB — POCT URINALYSIS DIPSTICK
Bilirubin, UA: NEGATIVE
Glucose, UA: NEGATIVE
Ketones, UA: NEGATIVE
Nitrite, UA: NEGATIVE
Protein, UA: NEGATIVE
Spec Grav, UA: 1.015 (ref 1.010–1.025)
Urobilinogen, UA: 0.2 E.U./dL
pH, UA: 6 (ref 5.0–8.0)

## 2018-10-22 MED ORDER — CIPROFLOXACIN HCL 500 MG PO TABS: 500.0000 mg | ORAL_TABLET | Freq: Two times a day (BID) | ORAL | 0 refills | Status: DC

## 2018-10-22 NOTE — Progress Notes (Signed)
Name: Zachary Walker   MRN: [id number]    DOB: 10/29/1940   Date:10/22/2018       Progress Note  Subjective  Chief Complaint  Chief Complaint  Patient presents with  . Hematuria    HPI  Patient endorses passing blood clots in urine started 2 days ago. Notes that the previous day he had been doing strenuous yard work and then noticed that his urine was a darker color that evening- he thought it was related to not drinking much water. The next day noticed his urine turned to a rusty color, drank a bunch more water and then noticed that it was red 2 days ago. States later in the day-2 days ago- he had strain an lot to urinate and was having difficulty initiating and then a clot came out and then he was able to urinate without issues. States yesterday morning was a little red colored and then ever since then has been back to normal. Denies lower back pain, flank pain or myalgias. Endorses mild dysuria states just right at the end.   Patient has a history of BPH,  former smoker. 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 He takes an 81 mg aspirin daily.   Hyperlipidemia Patient has a history of left carotid stenosis. Takes Lipitor  nightly Lab Results  Component Value Date   LDLCALC 77 05/06/2018    GERD Takes omeprazole PRN, states it is well controlled.  BPH  Uses saw palmetto.  IPSS Questionnaire (AUA-7): Over the past month.   1)  How often have you had a sensation of not emptying your bladder completely after you finish urinating?  1 - Less than 1 time in 5  2)  How often have you had to urinate again less than two hours after you finished urinating? 0 - Not at all  3)  How often have you found you stopped and started again several times when you urinated?  0 - Not at all  4) How difficult have you found it to postpone urination?  1 - Less than 1 time in 5  5) How often have  you had a weak urinary stream?  0 - Not at all  6) How often have you had to push or strain to begin urination?  1 - Less than 1 time in 5  7) How many times did you most typically get up to urinate from the time you went to bed until the time you got up in the morning?  1 - 1 time  Total score:  0-7 mildly symptomatic   8-19 moderately symptomatic   20-35 severely symptomatic     PHQ2/9: Depression screen [facility] 2/9 10/22/2018 09/26/2018 08/26/2018 03/04/2018 03/04/2018  Decreased Interest 0 0 0 0 0  Down, Depressed, Hopeless 0 0 0 0 0  PHQ - 2 Score 0 0 0 0 0  Altered sleeping 0 - 0 0 -  Tired, decreased energy 0 - 0 0 -  Change in appetite 0 - 0 0 -  Feeling bad or failure about yourself  0 - 0 0 -  Trouble concentrating 0 - 0 0 -  Moving slowly or fidgety/restless 0 - 0 0 -  Suicidal thoughts 0 - 0 0 -  PHQ-9 Score 0 - 0 0 -  Difficult doing work/chores Not difficult at all - Not difficult at all - -  PHQ reviewed. Negative  Patient Active Problem List   Diagnosis Date Noted  . Hyperlipidemia LDL goal <70 03/06/2018  . Elevated PSA 03/06/2018  . Other and unspecified hyperlipidemia 03/04/2018  . 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 Arthropathy, unspecified, other specified sites 03/04/2018  . Medication monitoring encounter 03/04/2018  . Nonexudative age-related macular degeneration, bilateral, early dry stage 11/23/2016  . 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 Left carotid stenosis 08/04/2013  . Sciatica 02/27/2011    Past Medical History:  Diagnosis Date  . GERD (gastroesophageal reflux disease)   . Hyperlipidemia   . Macular degeneration     Past Surgical History:  Procedure Laterality Date  . CAROTID STENT Left 2014    Social History   Tobacco Use  . Smoking status: Former Smoker    Types: Cigarettes    Last attempt to quit: 1978    Years since quitting: 42.4  . Smokeless tobacco: Former NeurosurgeonUser     Types: Chew    Quit date: 171978  . Tobacco comment: un aware of how many or how long he smoked  Substance Use Topics  . Alcohol use: Yes    Alcohol/week: 1.0 standard drinks    Types: 1 Cans of beer per week    Comment: 5 a month     Current Outpatient Medications:  .  acetaminophen (TYLENOL) 650 MG CR tablet, Take 1,300 mg by mouth every 12 (twelve) hours as needed for pain., Disp: , Rfl:  .  aspirin EC 81 MG tablet, Take 1 tablet (81 mg total) by mouth daily., Disp: , Rfl:  .  atorvastatin (LIPITOR) 20 MG tablet, TAKE 1 TABLET BY MOUTH AT BEDTIME, Disp: 90 tablet, Rfl: 0 .  Multiple Vitamin (MULTIVITAMIN) tablet, Take 1 tablet by mouth daily., Disp: , Rfl:  .  Multiple Vitamins-Minerals (PRESERVISION AREDS) CAPS, One by mouth twice a day, Disp: , Rfl: 0 .  omeprazole (PRILOSEC) 40 MG capsule, Take 1 capsule (40 mg total) by mouth daily., Disp: 90 capsule, Rfl: 1  No Known Allergies  ROS    No other specific complaints in a complete review of systems (except as listed in HPI above).  Objective  Vitals:   10/22/18 0925  BP: 130/82  Pulse: 91  Resp: 14  Temp: 98.2 F (36.8 C)  TempSrc: Oral  SpO2: 98%  Weight: 166 lb 1.6 oz (75.3 kg)  Height: 5\' 7"  (1.702 m)     Body mass index is 26.01 kg/m.  Nursing Note and Vital Signs reviewed.  Physical Exam Vitals signs reviewed.  Constitutional:      Appearance: He is well-developed.  HENT:     Head: Normocephalic and atraumatic.  Neck:     Musculoskeletal: Normal range of motion and neck supple.     Vascular: No carotid bruit.  Cardiovascular:     Heart sounds: Normal heart sounds.  Pulmonary:     Effort: Pulmonary effort is normal.     Breath sounds: Normal breath sounds.  Abdominal:     General: Bowel sounds are normal.     Palpations: Abdomen is soft.     Tenderness: There is no abdominal tenderness. There is no right CVA tenderness or left CVA tenderness.     Comments: No inguinal adenopathy    Musculoskeletal: Normal range of motion.  Skin:    General: Skin is warm and dry.     Capillary Refill: Capillary refill takes less than 2 seconds.  Neurological:  Mental Status: He is alert and oriented to person, place, and time.     GCS: GCS eye subscore is 4. GCS verbal subscore is 5. GCS motor subscore is 6.     Sensory: No sensory deficit.  Psychiatric:        Speech: Speech normal.        Behavior: Behavior normal.        Thought Content: Thought content normal.        Judgment: Judgment normal.       Results for orders placed or performed in visit on 10/22/18 (from the past 48 hour(s))  POCT Urinalysis Dipstick     Status: Abnormal   Collection Time: 10/22/18  9:31 AM  Result Value Ref Range   Color, UA yellow    Clarity, UA clear    Glucose, UA Negative Negative   Bilirubin, UA negative    Ketones, UA negative    Spec Grav, UA 1.015 1.010 - 1.025   Blood, UA hemolyzed trace    pH, UA 6.0 5.0 - 8.0   Protein, UA Negative Negative   Urobilinogen, UA 0.2 0.2 or 1.0 E.U./dL   Nitrite, UA negative    Leukocytes, UA Moderate (2+) (A) Negative   Appearance dark    Odor none     Assessment & Plan 1. Gross hematuria Possible passed kidney stone, infection, consider bladder cancer due to history and age- will recheck urine after infection clears.  - POCT Urinalysis Dipstick - COMPLETE METABOLIC PANEL WITH GFR - Urinalysis, Routine w reflex microscopic - PSA  2. Left carotid stenosis Lipid and BP control   3. Benign non-nodular prostatic hyperplasia without lower urinary tract symptoms - PSA  4. Hyperlipidemia LDL goal <70 Continue statin  - Lipid Profile  5. Medication monitoring encounter  - COMPLETE METABOLIC PANEL WITH GFR  6. Cystitis Follow up in 3 weeks, ER precautions given.  - Urinalysis, Routine w reflex microscopic - ciprofloxacin (CIPRO) 500 MG tablet; Take 1 tablet (500 mg total) by mouth 2 (two) times daily.  Dispense: 20 tablet; Refill:  0 - PSA  -Red flags and when to present for emergency care or RTC including fever >101.77F, chest pain, shortness of breath, myalgias with new/worsening/un-resolving symptoms, reviewed with patient at time of visit. Follow up and care instructions discussed and provided in AVS.

## 2018-10-22 NOTE — Patient Instructions (Signed)
- If your symptoms do not improve within 2 days let me know, please complete antibiotic for entire course. Please do not do strenuous activity and rest and drink plenty of water- at least 64 ounces a day. Avoid caffeine.  - If you develop fever, chills, worsening pain, nausea, vomiting or muscle aches please call us right away or get medical attention.  If you have not heard anything from my staff in a week about any orders/referrals/studies from today, please contact us here to follow-up [phone]  Urinary Tract Infection, Adult  A urinary tract infection (UTI) is an infection of any part of the urinary tract. The urinary tract includes the kidneys, ureters, bladder, and urethra. These organs make, store, and get rid of urine in the body. Your health care provider may use other names to describe the infection. An upper UTI affects the ureters and kidneys (pyelonephritis). A lower UTI affects the bladder (cystitis) and urethra (urethritis). What are the causes? Most urinary tract infections are caused by bacteria in your genital area, around the entrance to your urinary tract (urethra). These bacteria grow and cause inflammation of your urinary tract. What increases the risk? You are more likely to develop this condition if:  You have a urinary catheter that stays in place (indwelling).  You are not able to control when you urinate or have a bowel movement (you have incontinence).  You are male and you: ? Use a spermicide or diaphragm for birth control. ? Have low estrogen levels. ? Are pregnant.  You have certain genes that increase your risk (genetics).  You are sexually active.  You take antibiotic medicines.  You have a condition that causes your flow of urine to slow down, such as: ? An enlarged prostate, if you are male. ? Blockage in your urethra (stricture). ? A kidney stone. ? A nerve condition that affects your bladder control (neurogenic bladder). ? Not getting  enough to drink, or not urinating often.  You have certain medical conditions, such as: ? Diabetes. ? A weak disease-fighting system (immunesystem). ? Sickle cell disease. ? Gout. ? Spinal cord injury. What are the signs or symptoms? Symptoms of this condition include:  Needing to urinate right away (urgently).  Frequent urination or passing small amounts of urine frequently.  Pain or burning with urination.  Blood in the urine.  Urine that smells bad or unusual.  Trouble urinating.  Cloudy urine.  Vaginal discharge, if you are male.  Pain in the abdomen or the lower back. You may also have:  Vomiting or a decreased appetite.  Confusion.  Irritability or tiredness.  A fever.  Diarrhea. The first symptom in older adults may be confusion. In some cases, they may not have any symptoms until the infection has worsened. How is this diagnosed? This condition is diagnosed based on your medical history and a physical exam. You may also have other tests, including:  Urine tests.  Blood tests.  Tests for sexually transmitted infections (STIs). If you have had more than one UTI, a cystoscopy or imaging studies may be done to determine the cause of the infections. How is this treated? Treatment for this condition includes:  Antibiotic medicine.  Over-the-counter medicines to treat discomfort.  Drinking enough water to stay hydrated. If you have frequent infections or have other conditions such as a kidney stone, you may need to see a health care provider who specializes in the urinary tract (urologist). In rare cases, urinary tract infections can cause sepsis.  Sepsis is a life-threatening condition that occurs when the body responds to an infection. Sepsis is treated in the hospital with IV antibiotics, fluids, and other medicines. Follow these instructions at home:  Medicines  Take over-the-counter and prescription medicines only as told by your health care  provider.  If you were prescribed an antibiotic medicine, take it as told by your health care provider. Do not stop using the antibiotic even if you start to feel better. General instructions  Make sure you: ? Empty your bladder often and completely. Do not hold urine for long periods of time. ? Empty your bladder after sex. ? Wipe from front to back after a bowel movement if you are male. Use each tissue one time when you wipe.  Drink enough fluid to keep your urine pale yellow.  Keep all follow-up visits as told by your health care provider. This is important. Contact a health care provider if:  Your symptoms do not get better after 1-2 days.  Your symptoms go away and then return. Get help right away if you have:  Severe pain in your back or your lower abdomen.  A fever.  Nausea or vomiting. Summary  A urinary tract infection (UTI) is an infection of any part of the urinary tract, which includes the kidneys, ureters, bladder, and urethra.  Most urinary tract infections are caused by bacteria in your genital area, around the entrance to your urinary tract (urethra).  Treatment for this condition often includes antibiotic medicines.  If you were prescribed an antibiotic medicine, take it as told by your health care provider. Do not stop using the antibiotic even if you start to feel better.  Keep all follow-up visits as told by your health care provider. This is important. This information is not intended to replace advice given to you by your health care provider. Make sure you discuss any questions you have with your health care provider. Document Released: 03/01/2005 Document Revised: 11/29/2017 Document Reviewed: 11/29/2017 Elsevier Interactive Patient Education  2019 [employer]

## 2018-10-23 LAB — COMPLETE METABOLIC PANEL WITH GFR
AG Ratio: 1.6 (calc) (ref 1.0–2.5)
ALT: 9 U/L (ref 9–46)
AST: 16 U/L (ref 10–35)
Albumin: 4.2 g/dL (ref 3.6–5.1)
Alkaline phosphatase (APISO): 82 U/L (ref 35–144)
BUN: 13 mg/dL (ref 7–25)
CO2: 23 mmol/L (ref 20–32)
Calcium: 8.9 mg/dL (ref 8.6–10.3)
Chloride: 107 mmol/L (ref 98–110)
Creat: 1 mg/dL (ref 0.70–1.18)
GFR, Est African American: 84 mL/min/{1.73_m2} (ref >=60)
GFR, Est Non African American: 72 mL/min/{1.73_m2} (ref >=60)
Globulin: 2.6 g/dL (calc) (ref 1.9–3.7)
Glucose, Bld: 88 mg/dL (ref 65–99)
Potassium: 4.2 mmol/L (ref 3.5–5.3)
Sodium: 140 mmol/L (ref 135–146)
Total Bilirubin: 0.6 mg/dL (ref 0.2–1.2)
Total Protein: 6.8 g/dL (ref 6.1–8.1)

## 2018-10-23 LAB — URINALYSIS, ROUTINE W REFLEX MICROSCOPIC
Bilirubin Urine: NEGATIVE
Glucose, UA: NEGATIVE
Hgb urine dipstick: NEGATIVE
Ketones, ur: NEGATIVE
Leukocytes,Ua: NEGATIVE
Nitrite: NEGATIVE
Protein, ur: NEGATIVE
Specific Gravity, Urine: 1.014 (ref 1.001–1.03)
pH: 5.5 (ref 5.0–8.0)

## 2018-10-23 LAB — PSA: PSA: 7.5 ng/mL — ABNORMAL HIGH (ref <=4.0)

## 2018-10-23 LAB — LIPID PANEL
Cholesterol: 127 mg/dL (ref <200)
HDL: 38 mg/dL — ABNORMAL LOW (ref >=40)
LDL Cholesterol (Calc): 68 mg/dL (calc)
Non-HDL Cholesterol (Calc): 89 mg/dL (calc) (ref <130)
Total CHOL/HDL Ratio: 3.3 (calc) (ref <5.0)
Triglycerides: 129 mg/dL (ref <150)

## 2018-11-19 ENCOUNTER — Telehealth: Payer: Self-pay | Admitting: Family Medicine

## 2018-11-20 NOTE — Telephone Encounter (Signed)
Patient was supposed to have 3 week follow up with Benjamine Mola 2 weeks ago.  Please call to schedule.

## 2018-11-20 NOTE — Telephone Encounter (Signed)
Pt scheduled a telephone visit for 6.25.2020 with Benjamine Mola

## 2018-11-28 ENCOUNTER — Ambulatory Visit (INDEPENDENT_AMBULATORY_CARE_PROVIDER_SITE_OTHER): Payer: Medicare Other | Admitting: Nurse Practitioner

## 2018-11-28 ENCOUNTER — Encounter: Payer: Self-pay | Admitting: Nurse Practitioner

## 2018-11-28 ENCOUNTER — Other Ambulatory Visit: Payer: Self-pay

## 2018-11-28 DIAGNOSIS — R319 Hematuria, unspecified: Secondary | ICD-10-CM

## 2018-11-28 DIAGNOSIS — R972 Elevated prostate specific antigen [PSA]: Secondary | ICD-10-CM

## 2018-11-28 NOTE — Progress Notes (Signed)
Virtual Visit via Telephone Note  I connected with Zachary Walker on 11/28/18 at  9:00 AM EDT by telephone and verified that I am speaking with the correct person using two identifiers.   Staff discussed the limitations, risks, security and privacy concerns of performing an evaluation and management service by telephone and the availability of in person appointments. Staff also discussed with the patient that there may be a patient responsible charge related to this service. The patient expressed understanding and agreed to proceed.  Patients location: Home My location: home office  Other people in meeting: none    HPI  Patient presents for follow-up on hematuria. He noticed an episode of gross hematuria over a month ago and has not had any episodes since then. PSA checked and was elevated but had come down since last 8 month check. He sees Dr. Virl DiamondStoiff and states at his age he does not want any biopsies at this time. Discussed if it was necessary for a further work up of hematuria- patient states he would come in for a recheck.  Endorses nocturia, denies hematuria, flank pain, nausea, unexplained weight loss, abdominal pain, diarrhea, blood in stools.  Patient has remote smoking history.  PHQ2/9: Depression screen [facility] 2/9 11/28/2018 10/22/2018 09/26/2018 08/26/2018 03/04/2018  Decreased Interest 0 0 0 0 0  Down, Depressed, Hopeless 0 0 0 0 0  PHQ - 2 Score 0 0 0 0 0  Altered sleeping 0 0 - 0 0  Tired, decreased energy 0 0 - 0 0  Change in appetite 0 0 - 0 0  Feeling bad or failure about yourself  0 0 - 0 0  Trouble concentrating 0 0 - 0 0  Moving slowly or fidgety/restless 0 0 - 0 0  Suicidal thoughts 0 0 - 0 0  PHQ-9 Score 0 0 - 0 0  Difficult doing work/chores Not difficult at all Not difficult at all - Not difficult at all -     PHQ reviewed. Negative   Patient Active Problem List   Diagnosis Date Noted  . Hyperlipidemia LDL goal <70 03/06/2018  . Elevated PSA 03/06/2018  . Other  and unspecified hyperlipidemia 03/04/2018  . 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 Arthropathy, unspecified, other specified sites 03/04/2018  . Medication monitoring encounter 03/04/2018  . Nonexudative age-related macular degeneration, bilateral, early dry stage 11/23/2016  . 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 Left carotid stenosis 08/04/2013  . Sciatica 02/27/2011    Past Medical History:  Diagnosis Date  . GERD (gastroesophageal reflux disease)   . Hyperlipidemia   . Macular degeneration     Past Surgical History:  Procedure Laterality Date  . CAROTID STENT Left 2014    Social History   Tobacco Use  . Smoking status: Former Smoker    Types: Cigarettes    Quit date: 1978    Years since quitting: 42.5  . Smokeless tobacco: Former NeurosurgeonUser    Types: Chew    Quit date: 51978  . Tobacco comment: un aware of how many or how long he smoked  Substance Use Topics  . Alcohol use: Yes    Alcohol/week: 1.0 standard drinks    Types: 1 Cans of beer per week    Comment: 5 a month     Current Outpatient Medications:  .  acetaminophen (TYLENOL) 650 MG CR tablet, Take 1,300 mg by mouth every 12 (twelve) hours as needed for pain., Disp: , Rfl:  .  aspirin EC 81 MG tablet, Take 1 tablet (81 mg total) by mouth daily., Disp: , Rfl:  .  atorvastatin (LIPITOR) 20 MG tablet, TAKE 1 TABLET BY MOUTH EVERYDAY AT BEDTIME, Disp: 90 tablet, Rfl: 0 .  Multiple Vitamin (MULTIVITAMIN) tablet, Take 1 tablet by mouth daily., Disp: , Rfl:  .  Multiple Vitamins-Minerals (PRESERVISION AREDS) CAPS, One by mouth twice a day, Disp: , Rfl: 0 .  ciprofloxacin (CIPRO) 500 MG tablet, Take 1 tablet (500 mg total) by mouth 2 (two) times daily. (Patient not taking: Reported on 11/28/2018), Disp: 20 tablet, Rfl: 0 .  omeprazole (PRILOSEC) 40 MG capsule, Take 1 capsule (40 mg total) by mouth daily. (Patient not taking: Reported on 11/28/2018), Disp: 90 capsule,  Rfl: 1  No Known Allergies  ROS   No other specific complaints in a complete review of systems (except as listed in HPI above).  Objective  There were no vitals filed for this visit.   There is no height or weight on file to calculate BMI.  Patient is alert, able to speak in full sentences without difficulty.   Assessment & Plan  1. Elevated PSA, less than 10 ng/ml Discussed risks and benefits, follow-up with urology   2. Hematuria, unspecified type retest - Urinalysis, Routine w reflex microscopic    I discussed the assessment and treatment plan with the patient. The patient was provided an opportunity to ask questions and all were answered. The patient agreed with the plan and demonstrated an understanding of the instructions.   The patient was advised to call back or seek an in-person evaluation if the symptoms worsen or if the condition fails to improve as anticipated.  I provided 12 minutes of non-face-to-face time during this encounter.   Fredderick Severance, NP

## 2018-11-29 LAB — URINALYSIS, ROUTINE W REFLEX MICROSCOPIC
Bilirubin Urine: NEGATIVE
Glucose, UA: NEGATIVE
Hgb urine dipstick: NEGATIVE
Ketones, ur: NEGATIVE
Leukocytes,Ua: NEGATIVE
Nitrite: NEGATIVE
Protein, ur: NEGATIVE
Specific Gravity, Urine: 1.018 (ref 1.001–1.03)
pH: 6.5 (ref 5.0–8.0)

## 2019-01-29 ENCOUNTER — Ambulatory Visit (INDEPENDENT_AMBULATORY_CARE_PROVIDER_SITE_OTHER): Payer: Medicare Other | Admitting: Nurse Practitioner

## 2019-01-29 ENCOUNTER — Other Ambulatory Visit: Payer: Self-pay

## 2019-01-29 ENCOUNTER — Encounter: Payer: Self-pay | Admitting: Nurse Practitioner

## 2019-01-29 VITALS — BP 138/70 | HR 95 | Temp 96.9°F | Resp 14 | Ht 67.0 in | Wt 164.0 lb

## 2019-01-29 DIAGNOSIS — R030 Elevated blood-pressure reading, without diagnosis of hypertension: Secondary | ICD-10-CM | POA: Diagnosis not present

## 2019-01-29 DIAGNOSIS — Z20822 Contact with and (suspected) exposure to covid-19: Secondary | ICD-10-CM

## 2019-01-29 NOTE — Progress Notes (Signed)
Name: Zachary Walker   MRN: [id number]    DOB: August 02, 1940   Date:01/29/2019       Progress Note  Subjective  Chief Complaint  Chief Complaint  Patient presents with  . Hypertension    was told that his BP has been running. 1 headahce this morning, but no dizziness, no blurred vision    HPI  He is a part of research study [facility] macular [facility] and has been seen monthly there. States his blood pressure has been elevated the last few months was told to come in for evaluation. He has never been diagnosed with hypertension in the past or been on anti-hypertensive to his knowledge. Does not increase stress when going to for the study appointments. Patient endorses dull headache today after mowing his lawn- states he just got overheated and he drank water and went inside and it went away.  Has been eating healthy at home, not eating much salt.   Denies chest pain, changes in vision, lightheadedness, dizziness.   BP Readings from Last 3 Encounters:  01/29/19 138/70  10/22/18 130/82  08/26/18 114/76     PHQ2/9: Depression screen PHQ 2/9 01/29/2019 11/28/2018 10/22/2018 09/26/2018 08/26/2018  Decreased Interest 0 0 0 0 0  Down, Depressed, Hopeless 0 0 0 0 0  PHQ - 2 Score 0 0 0 0 0  Altered sleeping - 0 0 - 0  Tired, decreased energy - 0 0 - 0  Change in appetite - 0 0 - 0  Feeling bad or failure about yourself  - 0 0 - 0  Trouble concentrating - 0 0 - 0  Moving slowly or fidgety/restless - 0 0 - 0  Suicidal thoughts - 0 0 - 0  PHQ-9 Score - 0 0 - 0  Difficult doing work/chores - Not difficult at all Not difficult at all - Not difficult at all     PHQ reviewed. Negative  Patient Active Problem List   Diagnosis Date Noted  . Hyperlipidemia LDL goal <70 03/06/2018  . Elevated PSA 03/06/2018  . Other and unspecified hyperlipidemia 03/04/2018  . 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 Arthropathy, unspecified, other  specified sites 03/04/2018  . Medication monitoring encounter 03/04/2018  . Nonexudative age-related macular degeneration, bilateral, early dry stage 11/23/2016  . 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 Left carotid stenosis 08/04/2013  . Sciatica 02/27/2011    Past Medical History:  Diagnosis Date  . GERD (gastroesophageal reflux disease)   . Hyperlipidemia   . Macular degeneration     Past Surgical History:  Procedure Laterality Date  . CAROTID STENT Left 2014    Social History   Tobacco Use  . Smoking status: Former Smoker    Types: Cigarettes    Quit date: 1978    Years since quitting: 42.6  . Smokeless tobacco: Former NeurosurgeonUser    Types: Chew    Quit date: [id number]  . Tobacco comment: un aware of how many or how long he smoked  Substance Use Topics  . Alcohol use: Yes    Alcohol/week: 1.0 standard drinks    Types: 1 Cans of beer per week    Comment: 5 a month     Current Outpatient Medications:  .  acetaminophen (TYLENOL) 650 MG CR tablet, Take 1,300 mg by mouth every 12 (twelve) hours as needed for pain., Disp: , Rfl:  .  aspirin EC 81 MG tablet, Take 1 tablet (81 mg total) by  mouth daily., Disp: , Rfl:  .  atorvastatin (LIPITOR) 20 MG tablet, TAKE 1 TABLET BY MOUTH EVERYDAY AT BEDTIME, Disp: 90 tablet, Rfl: 0 .  Multiple Vitamin (MULTIVITAMIN) tablet, Take 1 tablet by mouth daily., Disp: , Rfl:  .  Multiple Vitamins-Minerals (PRESERVISION AREDS) CAPS, One by mouth twice a day, Disp: , Rfl: 0  No Known Allergies  ROS    No other specific complaints in a complete review of systems (except as listed in HPI above).  Objective  Vitals:   01/29/19 1426 01/29/19 1445  BP: (!) 142/74 138/70  Pulse: 95   Resp: 14   Temp: (!) 96.9 F (36.1 C)   TempSrc: Temporal   SpO2: 98%   Weight: 164 lb (74.4 kg)   Height: 5\' 7"  (1.702 m)     Body mass index is 25.69 kg/m.  Nursing Note and Vital Signs reviewed.  Physical Exam Constitutional:      Appearance:  Normal appearance. He is well-developed.  HENT:     Head: Normocephalic and atraumatic.     Right Ear: Hearing normal.     Left Ear: Hearing normal.  Eyes:     Conjunctiva/sclera: Conjunctivae normal.  Cardiovascular:     Rate and Rhythm: Normal rate and regular rhythm.     Heart sounds: Normal heart sounds.  Pulmonary:     Effort: Pulmonary effort is normal.     Breath sounds: Normal breath sounds.  Musculoskeletal: Normal range of motion.  Neurological:     Mental Status: He is alert and oriented to person, place, and time.  Psychiatric:        Speech: Speech normal.        Behavior: Behavior normal. Behavior is cooperative.        Thought Content: Thought content normal.        Judgment: Judgment normal.        No results found for this or any previous visit (from the past 48 hour(s)).  Assessment & Plan  1. Elevated blood pressure reading DASH guidelines discussed, for his age his blood pressure is presently within upper normal limits. Initial blood pressure was taken when he was rushing to make appointment, do not see benefit of adding medicine at this time. Recommend routine monitoring and follow-up sooner with elevated readings or concerning symptoms.

## 2019-01-29 NOTE — Patient Instructions (Signed)

## 2019-01-30 ENCOUNTER — Telehealth: Payer: Self-pay | Admitting: Family Medicine

## 2019-01-30 LAB — NOVEL CORONAVIRUS, NAA: SARS-CoV-2, NAA: NOT DETECTED

## 2019-01-30 NOTE — Telephone Encounter (Signed)
Negative COVID results given. Patient results "NOT Detected." Caller expressed understanding. ° °

## 2019-03-10 ENCOUNTER — Encounter: Payer: Self-pay | Admitting: Family Medicine

## 2019-03-10 ENCOUNTER — Ambulatory Visit (INDEPENDENT_AMBULATORY_CARE_PROVIDER_SITE_OTHER): Payer: Medicare Other | Admitting: Family Medicine

## 2019-03-10 ENCOUNTER — Ambulatory Visit
Admission: RE | Admit: 2019-03-10 | Discharge: 2019-03-10 | Disposition: A | Discharge status: Home / Self Care | Payer: Medicare Other | Source: Ambulatory Visit | Attending: Family Medicine | Admitting: Family Medicine

## 2019-03-10 ENCOUNTER — Telehealth: Payer: Self-pay | Admitting: Family Medicine

## 2019-03-10 ENCOUNTER — Other Ambulatory Visit: Payer: Self-pay

## 2019-03-10 VITALS — BP 122/82 | HR 83 | Temp 97.8°F | Resp 14 | Ht 67.0 in | Wt 164.8 lb

## 2019-03-10 DIAGNOSIS — M545 Low back pain, unspecified: Secondary | ICD-10-CM

## 2019-03-10 DIAGNOSIS — R296 Repeated falls: Secondary | ICD-10-CM

## 2019-03-10 MED ORDER — NAPROXEN 500 MG PO TABS: 500.0000 mg | ORAL_TABLET | Freq: Two times a day (BID) | ORAL | 0 refills | Status: DC | PRN

## 2019-03-10 MED ORDER — TIZANIDINE HCL 2 MG PO CAPS: 2.0000 mg | ORAL_CAPSULE | Freq: Three times a day (TID) | ORAL | 0 refills | Status: DC | PRN

## 2019-03-10 NOTE — Progress Notes (Signed)
Patient ID: Zachary ApleyJames W Coye, male    DOB: 01-Nov-1940, 78 y.o.   MRN: [id number]  PCP: Danelle Berryapia, Lunden Mcleish, PA-C  Chief Complaint  Patient presents with  . Fall    2 weeks ago  . Hip Pain    Right onset 1 month, fall made hip pain worse.  would like imaging    Subjective:   Zachary Walker is a 78 y.o. male, presents to clinic with CC of the following:  Fall The accident occurred more than 1 week ago. The fall occurred while standing. There was no blood loss. The point of impact was the buttocks. The pain is present in the buttocks, right hip and back. The symptoms are aggravated by standing and ambulation. Pertinent negatives include no abdominal pain, bowel incontinence, fever, headaches, hearing loss, hematuria, loss of consciousness, nausea, numbness, tingling, visual change or vomiting. He has tried immobilization for the symptoms. The treatment provided mild relief.  Back Pain This is a new problem. The current episode started 1 to 4 weeks ago. The problem occurs constantly (with shooting intermittent worse pain to right buttock and to hip/side and sometimes down leg). The problem has been gradually improving since onset. The pain is present in the sacro-iliac, lumbar spine and gluteal. The quality of the pain is described as aching, shooting and stabbing. Radiates to: did radiate down right leg, now no radiation. The pain is at a severity of 5/10. The pain is the same all the time. Exacerbated by: certain movements with walking or stepping. Pertinent negatives include no abdominal pain, bladder incontinence, bowel incontinence, dysuria, fever, headaches, numbness, paresis, paresthesias, pelvic pain, perianal numbness, tingling, weakness or weight loss. He has tried bed rest (tylenol, patches to back (unknown kind)) for the symptoms. The treatment provided mild relief.   2 falls in the past several months Most recent he was helping someone working on a deck and walking around a bunch of  construction supplies and tools and his "leg gave out on him"   Patient Active Problem List   Diagnosis Date Noted  . Hyperlipidemia LDL goal <70 03/06/2018  . Elevated PSA 03/06/2018  . Other and unspecified hyperlipidemia 03/04/2018  . 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 Arthropathy, unspecified, other specified sites 03/04/2018  . Medication monitoring encounter 03/04/2018  . Nonexudative age-related macular degeneration, bilateral, early dry stage 11/23/2016  . 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 Left carotid stenosis 08/04/2013  . Sciatica 02/27/2011      Current Outpatient Medications:  .  acetaminophen (TYLENOL) 650 MG CR tablet, Take 1,300 mg by mouth every 12 (twelve) hours as needed for pain., Disp: , Rfl:  .  aspirin EC 81 MG tablet, Take 1 tablet (81 mg total) by mouth daily., Disp: , Rfl:  .  atorvastatin (LIPITOR) 20 MG tablet, TAKE 1 TABLET BY MOUTH EVERYDAY AT BEDTIME, Disp: 90 tablet, Rfl: 0 .  Multiple Vitamin (MULTIVITAMIN) tablet, Take 1 tablet by mouth daily., Disp: , Rfl:  .  Multiple Vitamins-Minerals (PRESERVISION AREDS) CAPS, One by mouth twice a day, Disp: , Rfl: 0   No Known Allergies   Family History  Problem Relation Age of Onset  . Macular degeneration Mother   . Emphysema Mother   . Heart disease Brother      Social History   Socioeconomic History  . Marital status: Married    Spouse name: Windell MouldingRuth  . Number of children: 2  . Years of education:  15  . Highest education level: Some college, no degree  Occupational History    Comment: Chief Financial Officer at [employer]  . Financial resource strain: Not hard at all  . Food insecurity    Worry: Never true    Inability: Never true  . Transportation needs    Medical: No    Non-medical: No  Tobacco Use  . Smoking status: Former Smoker    Types: Cigarettes    Quit date: 1978    Years since quitting: 42.7  . Smokeless tobacco: Former  Systems developer    Types: Chew    Quit date: 22  . Tobacco comment: un aware of how many or how long he smoked  Substance and Sexual Activity  . Alcohol use: Yes    Alcohol/week: 1.0 standard drinks    Types: 1 Cans of beer per week    Comment: 5 a month  . Drug use: Never  . Sexual activity: Not Currently  Lifestyle  . Physical activity    Days per week: 5 days    Minutes per session: 40 min  . Stress: Not at all  Relationships  . Social connections    Talks on phone: More than three times a week    Gets together: Once a week    Attends religious service: More than 4 times per year    Active member of club or organization: No    Attends meetings of clubs or organizations: Never    Relationship status: Married  . Intimate partner violence    Fear of current or ex partner: No    Emotionally abused: No    Physically abused: No    Forced sexual activity: No  Other Topics Concern  . Not on file  Social History Narrative  . Not on file     Review of Systems  Constitutional: Negative.  Negative for fever and weight loss.  HENT: Negative.   Eyes: Negative.   Respiratory: Negative.   Cardiovascular: Negative.   Gastrointestinal: Negative.  Negative for abdominal pain, bowel incontinence, nausea and vomiting.  Endocrine: Negative.   Genitourinary: Negative.  Negative for bladder incontinence, dysuria, hematuria and pelvic pain.  Musculoskeletal: Positive for back pain.  Skin: Negative.   Allergic/Immunologic: Negative.   Neurological: Negative.  Negative for tingling, loss of consciousness, weakness, numbness, headaches and paresthesias.  Hematological: Negative.   Psychiatric/Behavioral: Negative.   All other systems reviewed and are negative.      Objective:   Vitals:   03/10/19 1512  BP: 122/82  Pulse: 83  Resp: 14  Temp: 97.8 F (36.6 C)  SpO2: 97%  Weight: 164 lb 12.8 oz (74.8 kg)  Height: 5\' 7"  (1.702 m)    Body mass index is 25.81 kg/m.  Physical Exam  Vitals signs and nursing note reviewed.  Constitutional:      General: He is not in acute distress.    Appearance: Normal appearance. He is well-developed. He is not diaphoretic.  HENT:     Head: Normocephalic and atraumatic.     Nose: Nose normal.  Eyes:     General:        Right eye: No discharge.        Left eye: No discharge.     Conjunctiva/sclera: Conjunctivae normal.     Pupils: Pupils are equal, round, and reactive to light.  Neck:     Musculoskeletal: Normal range of motion and neck supple.     Trachea: No tracheal deviation.  Cardiovascular:  Rate and Rhythm: Normal rate and regular rhythm.     Pulses:          Dorsalis pedis pulses are 2+ on the right side and 2+ on the left side.       Posterior tibial pulses are 2+ on the right side and 2+ on the left side.  Pulmonary:     Effort: Pulmonary effort is normal. No respiratory distress.     Breath sounds: No stridor.  Abdominal:     Tenderness: There is no abdominal tenderness.  Musculoskeletal: Normal range of motion.     Cervical back: Normal.     Thoracic back: Normal.     Lumbar back: He exhibits normal range of motion, no tenderness, no bony tenderness and no swelling.     Comments: Negative straight leg raise bilaterally No midline tenderness or step-off from cervical to lumbar spine, no paraspinal muscle tenderness from cervical to lumbar spine Right low back near SI area mild tenderness Right hip with normal range of motion no pain with internal rotation external rotation flexion or extension 5/5 strength to bilateral lower extremities grossly normal sensation to light touch to bilateral lower extremities  Skin:    General: Skin is warm and dry.     Capillary Refill: Capillary refill takes less than 2 seconds.     Coloration: Skin is not pale.     Findings: No rash.     Nails: There is no clubbing.   Neurological:     Mental Status: He is alert.     Sensory: No sensory deficit.     Motor: No tremor or  abnormal muscle tone.     Coordination: Coordination normal.     Gait: Gait abnormal.     Comments: Normal gait with small steps but any larger strides cause him to limp  Psychiatric:        Behavior: Behavior normal.      Results for orders placed or performed in visit on 01/29/19  Novel Coronavirus, NAA ([facility])   Specimen: Oropharyngeal(OP) collection in vial transport medium   OROPHARYNGEA  TESTING  Result Value Ref Range   SARS-CoV-2, NAA Not Detected Not Detected        Assessment & Plan:      ICD-10-CM   1. Acute right-sided low back pain without sciatica  M54.5 tizanidine (ZANAFLEX) 2 MG capsule    Ambulatory referral to Physical Therapy    DG Lumbar Spine Complete    naproxen (NAPROSYN) 500 MG tablet  2. Multiple falls  R29.6 Ambulatory referral to Physical Therapy    Patient with back pain and 2 falls with some pain in the right low back buttock area that was radiating down the leg but that has improved with some resting, still having some difficulty with taking normal steps and with large strides will feel his leg give out.  His physical exam was largely unremarkable little bit of tenderness over the SI joint but negative for sciatica, no red flags for cauda equina.  Will get lumbar spinal films, use naproxen and muscle relaxers, I do feel physical therapy would be very helpful and if he has any worsening with his gait or no improvement in his pain would like him to see a spinal specialist/back pain specialist would like to avoid falls.  Encouraged the patient and his wife to follow-up very closely if not improving in 2 to 3 weeks    Danelle Berry, PA-C 03/10/19 3:29 PM

## 2019-03-10 NOTE — Telephone Encounter (Signed)
Patient called and said that he has fallen 2X in a week and would like to get a MRI for his hip because he is in pain. He will also want to talk to pcp about this. Please call patient back,thanks.

## 2019-03-10 NOTE — Patient Instructions (Signed)
Heat Therapy Heat therapy can help ease sore, stiff, injured, and tight muscles and joints. Heat relaxes your muscles, which may help ease your pain and muscle spasms. Do not use heat therapy unless your doctor tells you to use it. How to use heat therapy There are several different kinds of heat therapy, including:  Moist heat pack.  Hot water bottle.  Electric heating pad.  Heated gel pack.  Heated wrap.  Warm water bath. Your doctor will tell you how to use heat therapy. In general, you should: 1. Place a towel between your skin and the heat source. 2. Leave the heat on for 20-30 minutes. Your skin may turn pink. 3. Remove the heat if your skin turns bright red. You should remove the heat source if you are unable to feel pain, heat, or cold. You are more likely to get burned if you leave it on the skin for too long. Your doctor may also tell you to take a warm water bath. To do this: 1. Put a non-slip pad in the bathtub to prevent a fall. 2. Fill the bathtub with warm water. 3. Check the water temperature. 4. Soak in the water for 15-20 minutes, or as told by your doctor. 5. Be careful when you stand up after the bath. You may feel dizzy. 6. Pat yourself dry after the bath. Do not rub your skin to dry it. General recommendations for heat therapy  Do not sleep while using heat therapy. Only use heat therapy while you are awake.  Your skin may turn pink while using heat therapy. Do not use heat therapy if your skin turns red.  Do not use heat therapy if you have a new injury.  High heat or using heat for a long time can cause burns. Be careful not to burn your skin when using heat therapy.  Do not use heat therapy on areas of your skin that are already irritated, such as with a rash or sunburn. Get help if you have:  Blisters, redness, swelling (puffiness), or numbness.  New pain.  Pain that is getting worse. Summary  Heat therapy is the use of heat to help ease  sore, stiff, injured, and tight muscles and joints.  There are different types of heat therapy. Your doctor will tell you which one to use.  Only use heat therapy while you are awake.  Watch your skin to make sure you do not get burned while using heat therapy. This information is not intended to replace advice given to you by your health care provider. Make sure you discuss any questions you have with your health care provider. Document Released: 08/14/2011 Document Revised: 07/15/2018 Document Reviewed: 06/02/2017 Elsevier Patient Education  2020 [employer]   Acute Back Pain, Adult Acute back pain is sudden and usually short-lived. It is often caused by an injury to the muscles and tissues in the back. The injury may result from:  A muscle or ligament getting overstretched or torn (strained). Ligaments are tissues that connect bones to each other. Lifting something improperly can cause a back strain.  Wear and tear (degeneration) of the spinal disks. Spinal disks are circular tissue that provides cushioning between the bones of the spine (vertebrae).  Twisting motions, such as while playing sports or doing yard work.  A hit to the back.  Arthritis. You may have a physical exam, lab tests, and imaging tests to find the cause of your pain. Acute back pain usually goes away with  rest and home care. Follow these instructions at home: Managing pain, stiffness, and swelling  Take over-the-counter and prescription medicines only as told by your health care provider.  Your health care provider may recommend applying ice during the first 24-48 hours after your pain starts. To do this: ? Put ice in a plastic bag. ? Place a towel between your skin and the bag. ? Leave the ice on for 20 minutes, 2-3 times a day.  If directed, apply heat to the affected area as often as told by your health care provider. Use the heat source that your health care provider recommends, such as a moist heat  pack or a heating pad. ? Place a towel between your skin and the heat source. ? Leave the heat on for 20-30 minutes. ? Remove the heat if your skin turns bright red. This is especially important if you are unable to feel pain, heat, or cold. You have a greater risk of getting burned. Activity   Do not stay in bed. Staying in bed for more than 1-2 days can delay your recovery.  Sit up and stand up straight. Avoid leaning forward when you sit, or hunching over when you stand. ? If you work at a desk, sit close to it so you do not need to lean over. Keep your chin tucked in. Keep your neck drawn back, and keep your elbows bent at a right angle. Your arms should look like the letter "L." ? Sit high and close to the steering wheel when you drive. Add lower back (lumbar) support to your car seat, if needed.  Take short walks on even surfaces as soon as you are able. Try to increase the length of time you walk each day.  Do not sit, drive, or stand in one place for more than 30 minutes at a time. Sitting or standing for long periods of time can put stress on your back.  Do not drive or use heavy machinery while taking prescription pain medicine.  Use proper lifting techniques. When you bend and lift, use positions that put less stress on your back: ? Milroy your knees. ? Keep the load close to your body. ? Avoid twisting.  Exercise regularly as told by your health care provider. Exercising helps your back heal faster and helps prevent back injuries by keeping muscles strong and flexible.  Work with a physical therapist to make a safe exercise program, as recommended by your health care provider. Do any exercises as told by your physical therapist. Lifestyle  Maintain a healthy weight. Extra weight puts stress on your back and makes it difficult to have good posture.  Avoid activities or situations that make you feel anxious or stressed. Stress and anxiety increase muscle tension and can make  back pain worse. Learn ways to manage anxiety and stress, such as through exercise. General instructions  Sleep on a firm mattress in a comfortable position. Try lying on your side with your knees slightly bent. If you lie on your back, put a pillow under your knees.  Follow your treatment plan as told by your health care provider. This may include: ? Cognitive or behavioral therapy. ? Acupuncture or massage therapy. ? Meditation or yoga. Contact a health care provider if:  You have pain that is not relieved with rest or medicine.  You have increasing pain going down into your legs or buttocks.  Your pain does not improve after 2 weeks.  You have pain at night.  You lose weight without trying.  You have a fever or chills. Get help right away if:  You develop new bowel or bladder control problems.  You have unusual weakness or numbness in your arms or legs.  You develop nausea or vomiting.  You develop abdominal pain.  You feel faint. Summary  Acute back pain is sudden and usually short-lived.  Use proper lifting techniques. When you bend and lift, use positions that put less stress on your back.  Take over-the-counter and prescription medicines and apply heat or ice as directed by your health care provider. This information is not intended to replace advice given to you by your health care provider. Make sure you discuss any questions you have with your health care provider. Document Released: 05/22/2005 Document Revised: 09/10/2018 Document Reviewed: 01/03/2017 Elsevier Patient Education  2020 [employer].

## 2019-03-10 NOTE — Telephone Encounter (Signed)
Pt is scheduled for this afternoon.  ?

## 2019-03-11 ENCOUNTER — Encounter: Payer: Self-pay | Admitting: Family Medicine

## 2019-03-11 DIAGNOSIS — I700 Atherosclerosis of aorta: Secondary | ICD-10-CM | POA: Insufficient documentation

## 2019-03-11 DIAGNOSIS — M4305 Spondylolysis, thoracolumbar region: Secondary | ICD-10-CM | POA: Insufficient documentation

## 2019-03-12 ENCOUNTER — Encounter: Payer: Self-pay | Admitting: Family Medicine

## 2019-03-18 ENCOUNTER — Ambulatory Visit: Payer: Medicare Other | Attending: Family Medicine | Admitting: Physical Therapy

## 2019-03-18 ENCOUNTER — Other Ambulatory Visit: Payer: Self-pay

## 2019-03-18 ENCOUNTER — Encounter: Payer: Self-pay | Admitting: Physical Therapy

## 2019-03-18 DIAGNOSIS — R2681 Unsteadiness on feet: Secondary | ICD-10-CM | POA: Diagnosis present

## 2019-03-18 DIAGNOSIS — G8929 Other chronic pain: Secondary | ICD-10-CM | POA: Diagnosis present

## 2019-03-18 DIAGNOSIS — M25551 Pain in right hip: Secondary | ICD-10-CM | POA: Diagnosis present

## 2019-03-18 DIAGNOSIS — M545 Low back pain: Secondary | ICD-10-CM | POA: Insufficient documentation

## 2019-03-18 DIAGNOSIS — Z9181 History of falling: Secondary | ICD-10-CM | POA: Diagnosis present

## 2019-03-18 DIAGNOSIS — M6281 Muscle weakness (generalized): Secondary | ICD-10-CM | POA: Diagnosis present

## 2019-03-18 NOTE — Therapy (Signed)
[facility] [address] [place], [address] Phone: [phone]   Fax:  [phone]  Physical Therapy Evaluation  Patient Details  Name: Zachary Walker MRN: [id number] Date of Birth: 1940-07-24 Referring Provider (PT): Danelle Berry, PA-C   Encounter Date: 03/18/2019  PT End of Session - 03/18/19 1115    Visit Number  1    Number of Visits  12    Date for PT Re-Evaluation  04/29/19    Authorization Type  [employer] reporting 03/18/2019    Authorization Time Period  certification period: 03/18/2019 - 04/29/2019    Authorization - Visit Number  1    Authorization - Number of Visits  10    PT Start Time  1015    PT Stop Time  1115    PT Time Calculation (min)  60 min    Activity Tolerance  Patient tolerated treatment well    Behavior During Therapy  [facility] for tasks assessed/performed       Past Medical History:  Diagnosis Date  . GERD (gastroesophageal reflux disease)   . Hyperlipidemia   . Macular degeneration     Past Surgical History:  Procedure Laterality Date  . CAROTID STENT Left 2014    There were no vitals filed for this visit.   Subjective Assessment - 03/18/19 1029    Subjective  Patient reports the original onset of back pain was 5 years ago due to a fall and the most recent flare up is about 4 weeks ago located at low back, buttocks and R hip. Patient had two trip fall and reports: the most recent fall while he was helping someone working on a deck and walking around a bunch of [employer] supplies and tools and his tripped by a brick. The pain is currently 4-5/10, and the worse pain can go up to 10/10; lowest pain is 0/10 when sitting.  Patient is currently retired. Patient enjoys playing baseball and golf and unable to play it now due to his pain at R gluteal region.    Pertinent History  Relevant past medical history and comorbidities include Macular degeneration, Left  carotid stenosis, Spondylolysis, thoracolumbar region, etc.; surgeries include CAROTID STENT 2017, etc., (See more details in above)    Limitations  Lifting;Walking    How long can you walk comfortably?  2 miles a day and pain at the end of the walking    Diagnostic tests  X-ray: There are anterior bridging osteophytes of the thoracolumbar spineas can be seen with diffuse idiopathic skeletal hyperostosis. Thereis degenerative disease with disc height loss throughout thethoracolumbar spine. There is bilateral facet arthropathy of thethoracolumbar spine most severe at L3-4, L4-5 and L5-S1.    Patient Stated Goals  Get ride of pain    Currently in Pain?  Yes    Pain Score  4     Pain Location  Back    Pain Orientation  Right    Pain Descriptors / Indicators  Aching    Pain Type  Chronic pain    Pain Onset  More than a month ago    Pain Frequency  Intermittent    Aggravating Factors   prolonged walking; bending over, tie shoes    Pain Relieving Factors  resting breaks, pain medication    Effect of Pain on Daily Activities  prolonged walking; bending over, tie shoes         [facility] PT Assessment - 03/18/19 0001  Assessment   Medical Diagnosis  Acute right-sided low back pain without sciatica; Multiple falls    Referring Provider (PT)  Danelle Berry, PA-C    Onset Date/Surgical Date  --   5 years ago   Hand Dominance  Right    Prior Therapy  yes many years ago      Precautions   Precautions  None      Restrictions   Weight Bearing Restrictions  No      Balance Screen   Has the patient fallen in the past 6 months  Yes      Home Environment   Living Environment  Private residence    Living Arrangements  Spouse/significant other;Children    Available Help at Discharge  Family    Type of Home  House    Home Access  Level entry      Prior Function   Level of Independence  Independent    Vocation  Retired      IT consultant   Overall Cognitive Status  Within Functional Limits for  tasks assessed      Observation/Other Assessments   Observations  see note from 03/18/2019 for latest objective data    Focus on Therapeutic Outcomes (FOTO)   63      Sensation   Light Touch  Appears Intact         OBJECTIVE  SENSATION: Grossly intact to light touch bilateral LEs as determined by testing dermatomes L2-S2   MUSCULOSKELETAL: Tremor: None Tone: Normal  Posture WFL   Gait Narrow base of support and reduced standing time on R LE  Palpation Tender to touch at R gluteal region  Strength (out of 5) R/L 4-/4- Hip flexion 4/4 Hip ER 4/4 Hip IR 4/4 Hip abduction 3/3 Hip adduction 3-/3- Hip extension* 5/5 Knee extension 5/5 Knee flexion 5/5 Ankle dorsiflexion *Indicates pain   AROM (degrees) R/L Lumbar forward flexion: limited pain full hand to mid lower thigh. Lumbar extension: limited no pain  Lumbar lateral flexion: Limited hand to mid thigh  Lumbar rotation: Limited turing from right to left pain at R gluteal region.  Hip Flexion: limited at R LE due to tightness at hamstring; L LE WFL Hip Abduction: WFL  *Indicates pain   Repeated Movements No centralization or peripheralization of symptoms with repeated lumbar extension.    Muscle Length Hamstrings: R: limited  L: WFL  Ober: WFL   Passive Accessory Intervertebral Motion (PAIVM) Pt denies reproduction of back pain with CPA C6-L5. Limited ROM throughout C6-L1    SPECIAL TESTS SLR: negtive at both side    POSTURAL CONTROL TESTS          Feet together - Eyes open: 15s    Tandem standing - R LE in front: 15s  - L LE in front: 5s   Single leg standing:  - R LE: 3 s - L LE: 10s   TREATMENT:  Therapeutic exercise: to centralize symptoms and improve ROM, strength, muscular endurance, and activity tolerance required for successful completion of functional activities.  - Supine bridge 3x10 reps  - Supine lower trunk rotation 2x10 reps  - Prone pressure 2x10 reps  - Standing  lumbar extension x 10.   HOME EXERCISE PROGRAM Access Code: [email or web address]  URL: [email or web address]  Date: 03/18/2019  Prepared by: Norton Blizzard   Exercises Supine Bridge - 3 sets - 10 reps - 1x daily - 7x weekly Supine Lower Trunk Rotation - 3 sets - 10 reps - 1x daily - 7x  weekly Prone Press Up - 3 sets - 10 reps - 1x daily - 7x weekly    PT Education - 03/18/19 1115    Education Details  Education: Exercise purpose/form. Self management techniques. Education on diagnosis, prognosis, POC, anatomy and physiology of current condition Education on HEP including handout    Person(s) Educated  Patient    Methods  Explanation;Demonstration;Tactile cues;Verbal cues;Handout    Comprehension  Verbalized understanding;Returned demonstration;Verbal cues required;Tactile cues required       PT Short Term Goals - 03/18/19 1243      PT SHORT TERM GOAL #1   Title  Be independent with initial home exercise program for self-management of symptoms.    Baseline  initial HEP provided at IE (03/18/2019);    Time  2    Period  Weeks    Status  New    Target Date  04/01/19        PT Long Term Goals - 03/18/19 1244      PT LONG TERM GOAL #1   Title  Be independent with a long-term home exercise program for self-management of symptoms.    Baseline  initial HEP provided at IE (03/18/2019);    Time  6    Period  Weeks    Status  New    Target Date  04/29/19      PT LONG TERM GOAL #2   Title  Demonstrate improved FOTO score by 10 units to demonstrate improvement in overall condition and self-reported functional ability.    Baseline  63 (03/18/2019);    Time  6    Period  Weeks    Status  New    Target Date  04/29/19      PT LONG TERM GOAL #3   Title  Reduce pain with functional activities to equal or less than 1/10 to allow patient to complete usual activities including ADLs, IADLs, and social engagement with less difficulty.    Baseline  4-5/10 (03/18/2019);    Time  6     Period  Weeks    Status  New    Target Date  04/29/19      PT LONG TERM GOAL #4   Title  Pt will increase strength of by at least 1/2 MMT grade in order to demonstrate improvement in strength and function.    Baseline  (03/18/2019);    Time  6    Period  Weeks    Status  New    Target Date  04/29/19      PT LONG TERM GOAL #5   Title  Complete community, work and/or recreational activities without limitation due to current condition.    Baseline  (03/18/2019);    Time  6    Period  Weeks    Status  New    Target Date  04/29/19      Additional Long Term Goals   Additional Long Term Goals  Yes      PT LONG TERM GOAL #6   Title  Improve tandem stance balance with eyes open on firm surface to equal or greater than 30 seconds each side to demonstrate improved balance for decreased fall risk.    Baseline  R front = 15 s, L front = 5 seconds. (03/18/2019);    Time  6    Period  Weeks    Status  New    Target Date  04/29/19             Plan -  03/18/19 1239    Clinical Impression Statement  Patient is a 78 y.o. male who presents to outpatient physical therapy with a referral for medical diagnosis of R side low back pain without sciatica and multiple falls. This patient presents with the sign and symptoms consistent with low back pain, R gluteal pain, impaired balance, stiffness, with functional activities/recreational activities. Upon assessment, pt demonstrated deficits such as deficits in strength, balance, mobility, weakness, significant pain with mobility, and limited lumbar ROM. These deficits limit the patient ability to perform things such as ADLs(tie his shoes), IADLs, social participation, caring for others, engaging in hobbies (playing golf), and impairs their quality of life. The pt will benefit from skilled PT services to address deficits and return to PLOF and independence, recreational activity and work.    Personal Factors and Comorbidities  Age;Comorbidity 3+     Comorbidities  Macular degeneration, Left carotid stenosis, Spondylolysis, thoracolumbar region, etc.; surgeries include CAROTID STENT 2017    Examination-Activity Limitations  Stairs;Bend;Lift;Carry    Examination-Participation Restrictions  Yard Work;Community Activity;Cleaning;Laundry    Stability/Clinical Decision Making  Evolving/Moderate complexity    Clinical Decision Making  Moderate    Rehab Potential  Good    PT Frequency  2x / week    PT Duration  6 weeks    PT Treatment/Interventions  ADLs/Self Care Home Management;Cryotherapy;Moist Heat;Gait training;Stair training;Functional mobility training;Therapeutic activities;Therapeutic exercise;Balance training;Neuromuscular re-education;Manual techniques;Passive range of motion;Joint Manipulations;Spinal Manipulations;Vestibular;Dry needling;Patient/family education    PT Next Visit Plan  Strengthening, stretching, HEP    PT Home Exercise Plan  Medbridge: [email or web address]    Consulted and Agree with Plan of Care  Patient       Patient will benefit from skilled therapeutic intervention in order to improve the following deficits and impairments:  Abnormal gait, Decreased balance, Decreased mobility, Difficulty walking, Increased muscle spasms, Pain, Impaired flexibility, Decreased activity tolerance, Decreased range of motion, Impaired perceived functional ability, Decreased strength, Hypomobility, Decreased endurance  Visit Diagnosis: Chronic right-sided low back pain, unspecified whether sciatica present  Pain in right hip  Muscle weakness (generalized)  History of falling  Unsteadiness on feet     Problem List Patient Active Problem List   Diagnosis Date Noted  . Abdominal aortic atherosclerosis ([place]) 03/11/2019  . 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 Hyperlipidemia LDL goal <70 03/06/2018  . Elevated PSA 03/06/2018  . Other and unspecified hyperlipidemia 03/04/2018  . Benign non-nodular prostatic hyperplasia  without lower urinary tract symptoms 03/04/2018  . Arthropathy, unspecified, other specified sites 03/04/2018  . Medication monitoring encounter 03/04/2018  . Nonexudative age-related macular degeneration, bilateral, early dry stage 11/23/2016  . 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 Left carotid stenosis 08/04/2013  . Sciatica 02/27/2011    Sherrilyn Rist, SPT 03/18/19, 2:09 PM  Everlean Alstrom. Graylon Good, PT, DPT 03/18/19, 2:10 PM  [facility] [address], [place], [address] Phone: [phone]   Fax:  [phone]  Name: Zachary Walker MRN: [id number] Date of Birth: [birth year]

## 2019-03-20 ENCOUNTER — Ambulatory Visit: Payer: Medicare Other | Admitting: Physical Therapy

## 2019-03-20 ENCOUNTER — Encounter: Payer: Self-pay | Admitting: Physical Therapy

## 2019-03-20 ENCOUNTER — Other Ambulatory Visit: Payer: Self-pay

## 2019-03-20 DIAGNOSIS — M545 Low back pain: Secondary | ICD-10-CM

## 2019-03-20 DIAGNOSIS — Z9181 History of falling: Secondary | ICD-10-CM

## 2019-03-20 DIAGNOSIS — M25551 Pain in right hip: Secondary | ICD-10-CM

## 2019-03-20 DIAGNOSIS — M6281 Muscle weakness (generalized): Secondary | ICD-10-CM

## 2019-03-20 DIAGNOSIS — R2681 Unsteadiness on feet: Secondary | ICD-10-CM

## 2019-03-20 DIAGNOSIS — G8929 Other chronic pain: Secondary | ICD-10-CM

## 2019-03-20 NOTE — Therapy (Signed)
[facility] [address] [place], [place], [address] Phone: [phone]   Fax:  [phone]  Physical Therapy Treatment  Patient Details  Name: Zachary Walker MRN: [id number] Date of Birth: 04/02/1941 Referring Provider (PT): Danelle Berryapia, Leisa, PA-C   Encounter Date: 03/20/2019  PT End of Session - 03/20/19 1336    Visit Number  2    Number of Visits  12    Date for PT Re-Evaluation  04/29/19    Authorization Type  [employer] reporting 03/18/2019    Authorization Time Period  certification period: 03/18/2019 - 04/29/2019    Authorization - Visit Number  2    Authorization - Number of Visits  10    PT Start Time  1030    PT Stop Time  1115    PT Time Calculation (min)  45 min    Activity Tolerance  Patient tolerated treatment well    Behavior During Therapy  [facility] for tasks assessed/performed       Past Medical History:  Diagnosis Date  . GERD (gastroesophageal reflux disease)   . Hyperlipidemia   . Macular degeneration     Past Surgical History:  Procedure Laterality Date  . CAROTID STENT Left 2014    There were no vitals filed for this visit.   TREATMENT:  Therapeutic exercise: to centralize symptoms and improve ROM, strength, muscular endurance, and activity tolerance required for successful completion of functional activities.  Subjective Assessment - 03/20/19 1036    Subjective  Patient states pain at right hip is 2-3/10 today. Patient states no problems with HEP.    Pertinent History  Relevant past medical history and comorbidities include Macular degeneration, Left carotid stenosis, Spondylolysis, thoracolumbar region, etc.; surgeries include CAROTID STENT 2017, etc., (See more details in above)    Limitations  Lifting;Walking    How long can you walk comfortably?  2 miles a day and pain at the end of the walking    Diagnostic tests  X-ray: There are anterior bridging osteophytes of  the thoracolumbar spineas can be seen with diffuse idiopathic skeletal hyperostosis. Thereis degenerative disease with disc height loss throughout thethoracolumbar spine. There is bilateral facet arthropathy of thethoracolumbar spine most severe at L3-4, L4-5 and L5-S1.    Patient Stated Goals  Get ride of pain    Currently in Pain?  Yes    Pain Score  4     Pain Onset  More than a month ago        TREATMENT:  Therapeutic exercise: to centralize symptoms and improve ROM, strength, muscular endurance, and activity tolerance required for successful completion of functional activities.   - NuStep level using bilateral upper and lower extremities. Setting 6. For improved extremity mobility, muscular endurance, and activity tolerance; and to induce the analgesic effect of aerobic exercise, stimulate improved joint nutrition, and prepare body structures and systems for following interventions. X 6  minutes. - Standing hip extension 3x10 each side with 3s hold. One UE support on bar at treadmill for balance. - Step up with 2lb ankle weight on each side of ankle. No UE support.  - Supine bridge 3x10 reps - Supine lower trunk rotation 2x10 reps - Standing golf swing to improve functional trunk rotation x 10  Neuromuscular Re-education: to improve, balance, postural strength, muscle activation patterns, and stabilization strength required for functional activities: - Tandem standing 2x30s with moderated postural sway when R foot behind.  - Ball toss 2x10  reps with each following foot placement to improve balance.   - Tandem   - Feet together  HOME EXERCISE PROGRAM Access Code: [email or web address]      URL: [email or web address]    Date: 03/18/2019  Prepared by: Rosita Kea   Exercises Supine Bridge - 3 sets - 10 reps - 1x daily - 7x weekly Supine Lower Trunk Rotation - 3 sets - 10 reps - 1x daily - 7x weekly Prone Press Up - 3 sets - 10 reps - 1x daily - 7x weekly   Clinical impression:   Patient tolerated the session well and continue progress toward goals. Patient demonstrated good muscle activation patterns, forms and techniques with HEP. Patient also demonstrated good understanding with cuing to improve proper muscle and posture with new strengthening tasks (step up). Further session will continue progress strengthening, endurance and balance. The pt will benefit from skilled PT services to address deficits and return to PLOF and independence, recreational activity and work.     PT Education - 03/20/19 1039    Education Details  Exercise purpose/form. Self management techniques.    Person(s) Educated  Patient    Methods  Explanation;Demonstration;Tactile cues;Verbal cues    Comprehension  Verbalized understanding;Returned demonstration;Verbal cues required;Tactile cues required       PT Short Term Goals - 03/18/19 1243      PT SHORT TERM GOAL #1   Title  Be independent with initial home exercise program for self-management of symptoms.    Baseline  initial HEP provided at IE (03/18/2019);    Time  2    Period  Weeks    Status  New    Target Date  04/01/19        PT Long Term Goals - 03/18/19 1244      PT LONG TERM GOAL #1   Title  Be independent with a long-term home exercise program for self-management of symptoms.    Baseline  initial HEP provided at IE (03/18/2019);    Time  6    Period  Weeks    Status  New    Target Date  04/29/19      PT LONG TERM GOAL #2   Title  Demonstrate improved FOTO score by 10 units to demonstrate improvement in overall condition and self-reported functional ability.    Baseline  63 (03/18/2019);    Time  6    Period  Weeks    Status  New    Target Date  04/29/19      PT LONG TERM GOAL #3   Title  Reduce pain with functional activities to equal or less than 1/10 to allow patient to complete usual activities including ADLs, IADLs, and social engagement with less difficulty.    Baseline  4-5/10 (03/18/2019);    Time  6     Period  Weeks    Status  New    Target Date  04/29/19      PT LONG TERM GOAL #4   Title  Pt will increase strength of by at least 1/2 MMT grade in order to demonstrate improvement in strength and function.    Baseline  (03/18/2019);    Time  6    Period  Weeks    Status  New    Target Date  04/29/19      PT LONG TERM GOAL #5   Title  Complete community, work and/or recreational activities without limitation due to current condition.    Baseline  (03/18/2019);  Time  6    Period  Weeks    Status  New    Target Date  04/29/19      Additional Long Term Goals   Additional Long Term Goals  Yes      PT LONG TERM GOAL #6   Title  Improve tandem stance balance with eyes open on firm surface to equal or greater than 30 seconds each side to demonstrate improved balance for decreased fall risk.    Baseline  R front = 15 s, L front = 5 seconds. (03/18/2019);    Time  6    Period  Weeks    Status  New    Target Date  04/29/19            Plan - 03/20/19 1336    Clinical Impression Statement  Patient tolerated the session well and continue progress toward goals. Patient demonstrated good muscle activation patterns, forms and techniques with HEP. Patient also demonstrated good understanding with cuing to improve proper muscle and posture with new strengthening tasks (step up). Further session will continue progress strengthening, endurance and balance. The pt will benefit from skilled PT services to address deficits and return to PLOF and independence, recreational activity and work.    Personal Factors and Comorbidities  Age;Comorbidity 3+    Comorbidities  Macular degeneration, Left carotid stenosis, Spondylolysis, thoracolumbar region, etc.; surgeries include CAROTID STENT 2017    Examination-Activity Limitations  Stairs;Bend;Lift;Carry    Examination-Participation Restrictions  Yard Work;Community Activity;Cleaning;Laundry    Stability/Clinical Decision Making  Evolving/Moderate  complexity    Rehab Potential  Good    PT Frequency  2x / week    PT Duration  6 weeks    PT Treatment/Interventions  ADLs/Self Care Home Management;Cryotherapy;Moist Heat;Gait training;Stair training;Functional mobility training;Therapeutic activities;Therapeutic exercise;Balance training;Neuromuscular re-education;Manual techniques;Passive range of motion;Joint Manipulations;Spinal Manipulations;Vestibular;Dry needling;Patient/family education    PT Next Visit Plan  Strengthening, stretching, HEP    PT Home Exercise Plan  Medbridge: [email or web address]    Consulted and Agree with Plan of Care  Patient       Patient will benefit from skilled therapeutic intervention in order to improve the following deficits and impairments:  Abnormal gait, Decreased balance, Decreased mobility, Difficulty walking, Increased muscle spasms, Pain, Impaired flexibility, Decreased activity tolerance, Decreased range of motion, Impaired perceived functional ability, Decreased strength, Hypomobility, Decreased endurance  Visit Diagnosis: Chronic right-sided low back pain, unspecified whether sciatica present  Pain in right hip  Muscle weakness (generalized)  History of falling  Unsteadiness on feet     Problem List Patient Active Problem List   Diagnosis Date Noted  . Abdominal aortic atherosclerosis ([facility]) 03/11/2019  . 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 Hyperlipidemia LDL goal <70 03/06/2018  . Elevated PSA 03/06/2018  . Other and unspecified hyperlipidemia 03/04/2018  . 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 Arthropathy, unspecified, other specified sites 03/04/2018  . Medication monitoring encounter 03/04/2018  . Nonexudative age-related macular degeneration, bilateral, early dry stage 11/23/2016  . 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 Left carotid stenosis 08/04/2013  . Sciatica 02/27/2011    Nelly Rout, SPT 03/20/19, 4:25  PM  Luretha Murphy. Ilsa Iha, PT, DPT 03/20/19, 4:25 PM    [facility] [address] [place], [address] Phone: [phone]   Fax:  [phone]  Name: Zachary Walker MRN: [id number] Date of Birth: [birth year]

## 2019-03-25 ENCOUNTER — Ambulatory Visit: Payer: Medicare Other | Admitting: Physical Therapy

## 2019-03-27 ENCOUNTER — Other Ambulatory Visit: Payer: Self-pay

## 2019-03-27 ENCOUNTER — Ambulatory Visit: Payer: Medicare Other | Admitting: Physical Therapy

## 2019-03-27 ENCOUNTER — Other Ambulatory Visit: Payer: Self-pay | Admitting: Family Medicine

## 2019-03-27 ENCOUNTER — Encounter: Payer: Self-pay | Admitting: Physical Therapy

## 2019-03-27 DIAGNOSIS — M6281 Muscle weakness (generalized): Secondary | ICD-10-CM

## 2019-03-27 DIAGNOSIS — Z9181 History of falling: Secondary | ICD-10-CM

## 2019-03-27 DIAGNOSIS — R2681 Unsteadiness on feet: Secondary | ICD-10-CM

## 2019-03-27 DIAGNOSIS — M545 Low back pain: Secondary | ICD-10-CM | POA: Diagnosis not present

## 2019-03-27 DIAGNOSIS — G8929 Other chronic pain: Secondary | ICD-10-CM

## 2019-03-27 DIAGNOSIS — M25551 Pain in right hip: Secondary | ICD-10-CM

## 2019-03-27 NOTE — Therapy (Signed)
[facility] [address], [place], [address] Phone: [phone]   Fax:  [phone]  Physical Therapy Treatment  Patient Details  Name: Zachary Walker MRN: [id number] Date of Birth: 25-Jan-1941 Referring Provider (PT): Delsa Grana, PA-C   Encounter Date: 03/27/2019  PT End of Session - 03/27/19 1111    Visit Number  3    Number of Visits  12    Date for PT Re-Evaluation  04/29/19    Authorization Type  [employer] reporting 03/18/2019    Authorization Time Period  certification period: 03/18/2019 - 04/29/2019    Authorization - Visit Number  3    Authorization - Number of Visits  10    PT Start Time  0901    PT Stop Time  0945    PT Time Calculation (min)  44 min    Equipment Utilized During Treatment  Gait belt    Activity Tolerance  Patient tolerated treatment well    Behavior During Therapy  [facility] for tasks assessed/performed       Past Medical History:  Diagnosis Date  . GERD (gastroesophageal reflux disease)   . Hyperlipidemia   . Macular degeneration     Past Surgical History:  Procedure Laterality Date  . CAROTID STENT Left 2014    There were no vitals filed for this visit.  Subjective Assessment - 03/27/19 1430    Subjective  Patient states no pain at R hip today. Patient states he has been doing his HEP at home and more active.    Pertinent History  Relevant past medical history and comorbidities include Macular degeneration, Left carotid stenosis, Spondylolysis, thoracolumbar region, etc.; surgeries include CAROTID STENT 2017, etc., (See more details in above)    Limitations  Lifting;Walking    How long can you walk comfortably?  2 miles a day and pain at the end of the walking    Diagnostic tests  X-ray: There are anterior bridging osteophytes of the thoracolumbar spineas can be seen with diffuse idiopathic skeletal hyperostosis. Thereis degenerative disease with disc  height loss throughout thethoracolumbar spine. There is bilateral facet arthropathy of thethoracolumbar spine most severe at L3-4, L4-5 and L5-S1.    Patient Stated Goals  Get ride of pain    Currently in Pain?  No/denies       TREATMENT:  Therapeutic exercise: to centralize symptoms and improve ROM, strength, muscular endurance, and activity tolerance required for successful completion of functional activities.  - NuStep level using bilateral upper and lower extremities. Setting 7. For improved extremity mobility, muscular endurance, and activity tolerance; and to induce the analgesic effect of aerobic exercise, stimulate improved joint nutrition, and prepare body structures and systems for following interventions X 7  minutes. - Standing hip extension 3x10 each side. One UE support on bar at treadmill for balance. Cuing to improve gluteal activation.  - Step up 5inch step with 2lb ankle weight on each side of ankle 2x20 reps each side . No UE support. (2nd set with 4lb dumbbells for each hand) - Step up on airex 20 reps each side - Half squat 3x10 reps to improve gluteal activation  - second set with 4lb dumbbell each hand - Ambulation 3x100 feet to improve dorsiflexion and feet clearance. 2lb ankle weights at each LE and last two set with 4lb dumbbell on each hand.  - completed Yellow Flag Risk Form (unbilled time at end of session, see below)   Neuromuscular  Re-education: to improve, balance, postural strength, muscle activation patterns, and stabilization strength required for functional activities: - Tandem/feet together standing 2x30s with mild postural sway when R foot behind.   - Repeat the above on airex 2x20s each position   - Single leg standing balance 4x30 s on each LE    HOME EXERCISE PROGRAM Access Code: [email or web address]      URL: [email or web address]    Date: 03/18/2019  Prepared by: Norton Blizzard    Exercises Supine Bridge - 3 sets - 10 reps - 1x daily - 7x  weekly Supine Lower Trunk Rotation - 3 sets - 10 reps - 1x daily - 7x weekly Prone Press Up - 3 sets - 10 reps - 1x daily - 7x weekly   Clinical impression:  Patient tolerated the session well and continue progress toward goals. Patient demonstrated improved muscle activation pattern with functional tasks (sit to stand). Patient also presented good motivation towards his rehab and achieve pain reduction within session at the end. Patient also demonstrated good improvements with balance tasks. With the above presentation, further session will continue progress strengthening, endurance and balance. The pt will benefit from skilled PT services to address deficits and return to PLOF and independence, recreational activity and work.   Yellow Flag Risk Form  1. Please indicate your usual level of pain during the past week. No Pain     Worst pain possible 0    1    2    3    4    5    6    7    8    9    10   2. Does pain, numbness, tingling, or weakness, extend into your leg (from back) &/or arm (from neck)? None of the time    All of the time 0    1    2    3    4    5    6    7    8    9    10   3. How would you rate your general health? Poor     Excellent  (score 10-X) 0    1    2    3    4    5    6    7    8    9    10   4. If you had to spend the rest of your life with your condition as it is right now, how would you feel about it? Delighted     Terrible 0    1    2    3    4    5    6    7    8    9    10   5. How anxious (eg. Tense, uptight, irritable, fearful, difficulty in concentrating / relaxing) have you been feeling during the past week? Not at all     Extremely anxious 0    1    2    3    4    5    6    7    8    9    10   6. How much have you been able to control (ie. Reduce / help) your pain / complaint on your own during the past week? I can reduce it    I can't reduce it at all 0    1  7. Please indicate how depressed (eg. down in  dumps, sad, downhearted, in low spiritis, pessimistic, feelings of hopelessness) have you been feeling in the past week. Not depressed at all   Extremely depressed 0    8. One a scale of 0-10, how certain are you that you will be doing normal activities or working in six months? Very certain    Not certain at all 0    9. I can do light work for an hour. Completely agree   Completely disagree 0    10. I can sleep at night. Completely agree   Completely disagree 0    11. An increase in pain is an indication that I should stop what I am doing until the pain decreases. Completely agree   Completely disagree 0    12. Physical activity makes my pain worse. Completely agree   Completely disagree 0    13. I should not do my normal activities including work, with my pain present. Completely agree   Completely disagree 0    Total score:  20 (score question 3 as 10-X)  Interpretation:  < 49 = low risk for psychosocial factors (green)    PT Education - 03/27/19 1110    Education Details  Exercise purpose/form. Self management techniques.    Person(s) Educated  Patient    Methods  Explanation;Demonstration;Tactile cues;Verbal cues    Comprehension  Verbalized understanding;Returned demonstration;Tactile cues required;Verbal cues required       PT Short Term Goals - 03/18/19 1243      PT SHORT TERM GOAL #1   Title  Be independent with initial home exercise program for self-management of symptoms.    Baseline  initial HEP provided at IE (03/18/2019);    Time  2    Period  Weeks    Status  New    Target Date  04/01/19        PT Long Term  Goals - 03/18/19 1244      PT LONG TERM GOAL #1   Title  Be independent with a long-term home exercise program for self-management of symptoms.    Baseline  initial HEP provided at IE (03/18/2019);    Time  6    Period  Weeks    Status  New    Target Date  04/29/19      PT LONG TERM GOAL #2   Title  Demonstrate improved FOTO score by 10 units to demonstrate improvement in overall condition and self-reported functional ability.    Baseline  63 (03/18/2019);    Time  6    Period  Weeks    Status  New    Target Date  04/29/19      PT LONG TERM GOAL #3   Title  Reduce pain with functional activities to equal or less than 1/10 to allow patient to complete usual activities including ADLs, IADLs, and social engagement with less difficulty.    Baseline  4-5/10 (03/18/2019);    Time  6    Period  Weeks    Status  New    Target Date  04/29/19      PT LONG TERM GOAL #4   Title  Pt will increase strength of by at least 1/2 MMT grade in order to demonstrate improvement in strength and function.    Baseline  (03/18/2019);    Time  6    Period  Weeks    Status  New    Target Date  04/29/19      PT LONG TERM GOAL #5   Title  Complete community, work and/or recreational activities without limitation due to current condition.    Baseline  (03/18/2019);    Time  6    Period  Weeks    Status  New    Target Date  04/29/19      Additional Long Term Goals   Additional Long Term Goals  Yes      PT LONG TERM GOAL #6   Title  Improve tandem stance balance with eyes open on firm surface to equal or greater than 30 seconds each side to demonstrate improved balance for decreased fall risk.    Baseline  R front = 15 s, L front = 5 seconds. (03/18/2019);    Time  6    Period  Weeks    Status  New    Target Date  04/29/19            Plan - 03/27/19 1110    Clinical Impression Statement  Patient tolerated the session well and continue progress toward goals. Patient demonstrated improved  muscle activation pattern with functional tasks (sit to stand). Patient also presented good motivation towards his rehab and achieve pain reduction within session at the end. Patient also demonstrated good improvements with balance tasks. With the above presentation, further session will continue progress strengthening, endurance and balance. The pt will benefit from skilled PT services to address deficits and return to PLOF and independence, recreational activity and work.    Personal Factors and Comorbidities  Age;Comorbidity 3+    Comorbidities  Macular degeneration, Left carotid stenosis, Spondylolysis, thoracolumbar region, etc.; surgeries include CAROTID STENT 2017    Examination-Activity Limitations  Stairs;Bend;Lift;Carry    Examination-Participation Restrictions  Yard Work;Community Activity;Cleaning;Laundry    Stability/Clinical Decision Making  Evolving/Moderate complexity    Rehab Potential  Good    PT Frequency  2x / week    PT Duration  6 weeks    PT Treatment/Interventions  ADLs/Self Care Home Management;Cryotherapy;Moist Heat;Gait training;Stair training;Functional mobility training;Therapeutic activities;Therapeutic exercise;Balance training;Neuromuscular re-education;Manual techniques;Passive range of motion;Joint Manipulations;Spinal Manipulations;Vestibular;Dry needling;Patient/family education    PT Next Visit Plan  Strengthening, stretching, HEP    PT Home Exercise Plan  Medbridge: [email or web address]    Consulted and Agree with Plan of Care  Patient       Patient will benefit from skilled therapeutic intervention in order to improve the following deficits and impairments:  Abnormal gait, Decreased balance, Decreased mobility, Difficulty walking, Increased muscle spasms, Pain, Impaired flexibility, Decreased activity tolerance, Decreased range of motion, Impaired perceived functional ability, Decreased strength, Hypomobility, Decreased endurance  Visit Diagnosis: Chronic right-sided low  back pain, unspecified whether sciatica present  Pain in right hip  Muscle weakness (generalized)  History of falling  Unsteadiness on feet     Problem List Patient Active Problem List   Diagnosis Date Noted  . Abdominal aortic atherosclerosis (HCC) 03/11/2019  . 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 Hyperlipidemia LDL goal <70 03/06/2018  . Elevated PSA 03/06/2018  . Other and unspecified hyperlipidemia 03/04/2018  . 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 Arthropathy, unspecified, other specified sites 03/04/2018  . Medication monitoring encounter 03/04/2018  . Nonexudative age-related macular degeneration, bilateral, early dry stage 11/23/2016  . 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 Left carotid stenosis 08/04/2013  . Sciatica 02/27/2011    Nelly RoutNan Julie Nay, SPT 03/27/19, 2:31 PM  Luretha MurphySara R. Ilsa IhaSnyder, PT, DPT 03/27/19, 2:32 PM  [facility] [address] [place], [place], [address] Phone: [phone]   Fax:  [phone]  Name: Zachary Walker MRN: [id number] Date of Birth: July 17, 1940

## 2019-04-01 ENCOUNTER — Ambulatory Visit: Payer: Medicare Other | Admitting: Physical Therapy

## 2019-04-01 ENCOUNTER — Encounter: Payer: Self-pay | Admitting: Physical Therapy

## 2019-04-01 ENCOUNTER — Other Ambulatory Visit: Payer: Self-pay

## 2019-04-01 DIAGNOSIS — M25551 Pain in right hip: Secondary | ICD-10-CM

## 2019-04-01 DIAGNOSIS — R2681 Unsteadiness on feet: Secondary | ICD-10-CM

## 2019-04-01 DIAGNOSIS — G8929 Other chronic pain: Secondary | ICD-10-CM

## 2019-04-01 DIAGNOSIS — M6281 Muscle weakness (generalized): Secondary | ICD-10-CM

## 2019-04-01 DIAGNOSIS — M545 Low back pain: Secondary | ICD-10-CM | POA: Diagnosis not present

## 2019-04-01 DIAGNOSIS — Z9181 History of falling: Secondary | ICD-10-CM

## 2019-04-01 NOTE — Therapy (Signed)
[facility] [address] [place], [address] Phone: [phone]   Fax:  [phone]  Physical Therapy Treatment  Patient Details  Name: Zachary Walker MRN: [id number] Date of Birth: 1941/02/26 Referring Provider (PT): Danelle Berry, PA-C   Encounter Date: 04/01/2019  PT End of Session - 04/01/19 0955    Visit Number  4    Number of Visits  12    Date for PT Re-Evaluation  04/29/19    Authorization Type  [employer] reporting 03/18/2019    Authorization Time Period  certification period: 03/18/2019 - 04/29/2019    Authorization - Visit Number  4    Authorization - Number of Visits  10    PT Start Time  0947    PT Stop Time  1030    PT Time Calculation (min)  43 min    Equipment Utilized During Treatment  Gait belt    Activity Tolerance  Patient tolerated treatment well    Behavior During Therapy  [facility] for tasks assessed/performed       Past Medical History:  Diagnosis Date  . GERD (gastroesophageal reflux disease)   . Hyperlipidemia   . Macular degeneration     Past Surgical History:  Procedure Laterality Date  . CAROTID STENT Left 2014    There were no vitals filed for this visit.  Subjective Assessment - 04/01/19 0951    Subjective  Patient states only a little sore at R hip today when he woke up this morning but no pain after walking around. Patient reports he is able to tie his shoes which he was not able to do two weeks ago.    Pertinent History  Relevant past medical history and comorbidities include Macular degeneration, Left carotid stenosis, Spondylolysis, thoracolumbar region, etc.; surgeries include CAROTID STENT 2017, etc., (See more details in above)    Limitations  Lifting;Walking    How long can you walk comfortably?  2 miles a day and pain at the end of the walking    Diagnostic tests  X-ray: There are anterior bridging osteophytes of the thoracolumbar spineas can be  seen with diffuse idiopathic skeletal hyperostosis. Thereis degenerative disease with disc height loss throughout thethoracolumbar spine. There is bilateral facet arthropathy of thethoracolumbar spine most severe at L3-4, L4-5 and L5-S1.    Patient Stated Goals  Get ride of pain    Currently in Pain?  No/denies      TREATMENT: Therapeutic exercise:to centralize symptoms and improve ROM, strength, muscular endurance, and activity tolerance required for successful completion of functional activities. - NuStep level using B lower extremities. Setting8. For improved extremity mobility, muscular endurance, and activity tolerance; and to induce the analgesic effect of aerobic exercise, stimulate improved joint nutrition, and prepare body structures and systems for following interventions X64minutes.  - Standing hip extension 3x10 each side. One UE support on bar at treadmill for balance. Cuing to improve gluteal activation.   - Machine x 40lb hip extension 3x10 each side. 3s hold on last set  - Machine x 40lb hip abduction x10 reps each  Cuing to improve proper muscle activation and isolation movements from upper trunk  - Half squat 3x10 reps to improve gluteal activation             - second set with 5lb dumbbell each hand Cuing to improve gluteal activation.  - Ambulation 40feet x 5 between exercises as testing for pain responses   Neuromuscular Re-education:to  improve, balance, postural strength, muscle activation patterns, and stabilization strength required for functional activities: - Tandem/feet together standing 2x30s with mild postural sway when R foot behind.              - Repeat the above on airex 1x20s each position              - Single leg standing balance 3x30 s on each LE. R LE with x 2 bar touch to keep balance at each set - Heel to toe walking 8 feet x 6 reps without hand assist to improve functional balance. Multiple times touching stationary bar to keep  balance.   HOME EXERCISE PROGRAM Access Code: [email or web address]  URL: [email or web address]  Date: 04/01/2019  Prepared by: Norton Blizzard   Exercises Supine Lower Trunk Rotation - 3 sets - 10 reps - 1x daily - 7x weekly Prone Press Up - 3 sets - 10 reps - 1x daily - 7x weekly Mini Squat with Chair - 3 sets - 10 reps - 1x daily - 7x weekly   Clinical impression:  Patient tolerated the session well and continue progress toward goals. Patient demonstrated good muscle activation and increased tolerance to weighted exercises. HEP updated and patient able to complete with proper forms and techniques. Patient still has difficulties with single leg balance but has been showing improvements. With the above presentation, further session will continue progress strengthening, endurance and balance.The pt will benefit from skilled PT services to address deficits and return to PLOF and independence, recreational activity and work.     PT Education - 04/01/19 0954    Education Details  Exercise purpose/form. Self management techniques.    Person(s) Educated  Patient    Methods  Explanation;Demonstration;Tactile cues;Verbal cues    Comprehension  Verbalized understanding;Returned demonstration;Tactile cues required;Verbal cues required       PT Short Term Goals - 04/01/19 0956      PT SHORT TERM GOAL #1   Title  Be independent with initial home exercise program for self-management of symptoms.    Baseline  initial HEP provided at IE (03/18/2019);    Time  2    Period  Weeks    Status  Achieved    Target Date  04/01/19        PT Long Term Goals - 03/18/19 1244      PT LONG TERM GOAL #1   Title  Be independent with a long-term home exercise program for self-management of symptoms.    Baseline  initial HEP provided at IE (03/18/2019);    Time  6    Period  Weeks    Status  New    Target Date  04/29/19      PT LONG TERM GOAL #2   Title  Demonstrate improved FOTO score by 10  units to demonstrate improvement in overall condition and self-reported functional ability.    Baseline  63 (03/18/2019);    Time  6    Period  Weeks    Status  New    Target Date  04/29/19      PT LONG TERM GOAL #3   Title  Reduce pain with functional activities to equal or less than 1/10 to allow patient to complete usual activities including ADLs, IADLs, and social engagement with less difficulty.    Baseline  4-5/10 (03/18/2019);    Time  6    Period  Weeks    Status  New    Target Date  04/29/19  PT LONG TERM GOAL #4   Title  Pt will increase strength of by at least 1/2 MMT grade in order to demonstrate improvement in strength and function.    Baseline  (03/18/2019);    Time  6    Period  Weeks    Status  New    Target Date  04/29/19      PT LONG TERM GOAL #5   Title  Complete community, work and/or recreational activities without limitation due to current condition.    Baseline  (03/18/2019);    Time  6    Period  Weeks    Status  New    Target Date  04/29/19      Additional Long Term Goals   Additional Long Term Goals  Yes      PT LONG TERM GOAL #6   Title  Improve tandem stance balance with eyes open on firm surface to equal or greater than 30 seconds each side to demonstrate improved balance for decreased fall risk.    Baseline  R front = 15 s, L front = 5 seconds. (03/18/2019);    Time  6    Period  Weeks    Status  New    Target Date  04/29/19            Plan - 04/01/19 0956    Clinical Impression Statement  Patient tolerated the session well and continue progress toward goals. Patient demonstrated good muscle activation and increased tolerance to weighted exercises. HEP updated and patient able to complete with proper forms and techniques. Patient still has difficulties with single leg balance but has been showing improvements. With the above presentation, further session will continue progress strengthening, endurance and balance. The pt will benefit  from skilled PT services to address deficits and return to PLOF and independence, recreational activity and work.    Personal Factors and Comorbidities  Age;Comorbidity 3+    Comorbidities  Macular degeneration, Left carotid stenosis, Spondylolysis, thoracolumbar region, etc.; surgeries include CAROTID STENT 2017    Examination-Activity Limitations  Stairs;Bend;Lift;Carry    Examination-Participation Restrictions  Yard Work;Community Activity;Cleaning;Laundry    Stability/Clinical Decision Making  Evolving/Moderate complexity    Rehab Potential  Good    PT Frequency  2x / week    PT Duration  6 weeks    PT Treatment/Interventions  ADLs/Self Care Home Management;Cryotherapy;Moist Heat;Gait training;Stair training;Functional mobility training;Therapeutic activities;Therapeutic exercise;Balance training;Neuromuscular re-education;Manual techniques;Passive range of motion;Joint Manipulations;Spinal Manipulations;Vestibular;Dry needling;Patient/family education    PT Next Visit Plan  Strengthening, stretching, HEP    PT Home Exercise Plan  Medbridge: [email or web address]    Consulted and Agree with Plan of Care  Patient       Patient will benefit from skilled therapeutic intervention in order to improve the following deficits and impairments:  Abnormal gait, Decreased balance, Decreased mobility, Difficulty walking, Increased muscle spasms, Pain, Impaired flexibility, Decreased activity tolerance, Decreased range of motion, Impaired perceived functional ability, Decreased strength, Hypomobility, Decreased endurance  Visit Diagnosis: Chronic right-sided low back pain, unspecified whether sciatica present  Pain in right hip  Muscle weakness (generalized)  History of falling  Unsteadiness on feet     Problem List Patient Active Problem List   Diagnosis Date Noted  . Abdominal aortic atherosclerosis (HCC) 03/11/2019  . 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 Hyperlipidemia LDL goal <70  03/06/2018  . Elevated PSA 03/06/2018  . Other and unspecified hyperlipidemia 03/04/2018  . 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  Arthropathy, unspecified, other specified sites 03/04/2018  . Medication monitoring encounter 03/04/2018  . Nonexudative age-related macular degeneration, bilateral, early dry stage 11/23/2016  . 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 Left carotid stenosis 08/04/2013  . Sciatica 02/27/2011    Sherrilyn Rist, SPT 04/01/19, 2:48 PM  Everlean Alstrom. Graylon Good, PT, DPT 04/01/19, 2:48 PM  [facility] [address], [place], [address] Phone: [phone]   Fax:  [phone]  Name: ASIR BINGLEY MRN: [id number] Date of Birth: [birth year]

## 2019-04-03 ENCOUNTER — Other Ambulatory Visit: Payer: Self-pay

## 2019-04-03 ENCOUNTER — Encounter: Payer: Self-pay | Admitting: Physical Therapy

## 2019-04-03 ENCOUNTER — Ambulatory Visit: Payer: Medicare Other | Admitting: Physical Therapy

## 2019-04-03 DIAGNOSIS — Z9181 History of falling: Secondary | ICD-10-CM

## 2019-04-03 DIAGNOSIS — M25551 Pain in right hip: Secondary | ICD-10-CM

## 2019-04-03 DIAGNOSIS — G8929 Other chronic pain: Secondary | ICD-10-CM

## 2019-04-03 DIAGNOSIS — M6281 Muscle weakness (generalized): Secondary | ICD-10-CM

## 2019-04-03 DIAGNOSIS — R2681 Unsteadiness on feet: Secondary | ICD-10-CM

## 2019-04-03 DIAGNOSIS — M545 Low back pain, unspecified: Secondary | ICD-10-CM

## 2019-04-03 NOTE — Therapy (Signed)
[place] [facility] [address], [place], [address] Phone: [phone]   Fax:  [phone]  Physical Therapy Treatment  Patient Details  Name: Zachary Walker MRN: [id number] Date of Birth: 01-Nov-1940 Referring Provider (PT): Delsa Grana, PA-C   Encounter Date: 04/03/2019  PT End of Session - 04/03/19 1426    Visit Number  5    Number of Visits  12    Date for PT Re-Evaluation  04/29/19    Authorization Type  [employer] reporting 03/18/2019    Authorization Time Period  certification period: 03/18/2019 - 04/29/2019    Authorization - Visit Number  5    Authorization - Number of Visits  10    PT Start Time  1426    PT Stop Time  1510    PT Time Calculation (min)  44 min    Equipment Utilized During Treatment  Gait belt    Activity Tolerance  Patient tolerated treatment well    Behavior During Therapy  [facility] for tasks assessed/performed       Past Medical History:  Diagnosis Date  . GERD (gastroesophageal reflux disease)   . Hyperlipidemia   . Macular degeneration     Past Surgical History:  Procedure Laterality Date  . CAROTID STENT Left 2014    There were no vitals filed for this visit.  Subjective Assessment - 04/03/19 1425    Subjective  Patient states muscle soreness at bilateral hips today due to HEP he did yesterday. He also walked 2 miles yesterday. He has been doing well recently and felt pretty good.    Pertinent History  Relevant past medical history and comorbidities include Macular degeneration, Left carotid stenosis, Spondylolysis, thoracolumbar region, etc.; surgeries include CAROTID STENT 2017, etc., (See more details in above)    Limitations  Lifting;Walking    How long can you walk comfortably?  2 miles a day and pain at the end of the walking    Diagnostic tests  X-ray: There are anterior bridging osteophytes of the thoracolumbar spineas can be seen with diffuse  idiopathic skeletal hyperostosis. Thereis degenerative disease with disc height loss throughout thethoracolumbar spine. There is bilateral facet arthropathy of thethoracolumbar spine most severe at L3-4, L4-5 and L5-S1.    Patient Stated Goals  Get ride of pain    Currently in Pain?  No/denies    Pain Score  0-No pain        TREATMENT: Therapeutic exercise:to centralize symptoms and improve ROM, strength, muscular endurance, and activity tolerance required for successful completion of functional activities. -NuStep level using B lower extremities. Setting8. For improved extremity mobility, muscular endurance, and activity tolerance; and to induce the analgesic effect of aerobic exercise, stimulate improved joint nutrition, and prepare body structures and systems for following interventionsX45minutes.  - LE strengthening.             - Machine x 55lb hip extension 3x10 each side. 3s hold on last set             - Machine x 55lb hip abduction 3x10 reps each  Cuing to improve proper muscle activation and isolation movements from upper trunk  - Ambulation 20feet x 3 between exercises as testing for pain responses   Neuromuscular Re-education:to improve, balance, postural strength, muscle activation patterns, and stabilization strength required for functional activities: - Tandem/feet together standing  - On airex 1x20s each position - Single leg standing balance 6x30 s on each LE.  R LE with x 2 bar touch to keep balance at each set   - SLS with toe tapping x402mins each LE   - SLS with toe tapping for soccor x 1 mins each LE.  - Heel to toe walking 8 feet x 5 reps without hand assist to improve functional balance. Multiple times touching stationary bar to keep balance.   HOME EXERCISE PROGRAM Access Code: [email or web address]     URL: [email or web address]    Date: 04/01/2019  Prepared by: Norton BlizzardSara Snyder   Exercises Supine Lower Trunk Rotation - 3 sets  - 10 reps - 1x daily - 7x weekly Prone Press Up - 3 sets - 10 reps - 1x daily - 7x weekly Mini Squat with Chair - 3 sets - 10 reps - 1x daily - 7x weekly   Clinical impression:  Patient tolerated the session well and continue progress toward his goals. Patient demonstrated improved strength and activity endurance with weighted exercises but still presented lack of muscle endurance for higher level balance tasks. Further treatment will focus on neuro-reeducation, balance, and functional mobility.The pt will benefit from skilled PT services to address deficits and return to PLOF and independence, recreational activity and work.    PT Education - 04/03/19 1426    Education Details  Exercise purpose/form. Self management techniques.    Person(s) Educated  Patient    Methods  Explanation;Demonstration;Tactile cues;Verbal cues    Comprehension  Verbalized understanding;Returned demonstration;Tactile cues required;Verbal cues required       PT Short Term Goals - 04/01/19 0956      PT SHORT TERM GOAL #1   Title  Be independent with initial home exercise program for self-management of symptoms.    Baseline  initial HEP provided at IE (03/18/2019);    Time  2    Period  Weeks    Status  Achieved    Target Date  04/01/19        PT Long Term Goals - 03/18/19 1244      PT LONG TERM GOAL #1   Title  Be independent with a long-term home exercise program for self-management of symptoms.    Baseline  initial HEP provided at IE (03/18/2019);    Time  6    Period  Weeks    Status  New    Target Date  04/29/19      PT LONG TERM GOAL #2   Title  Demonstrate improved FOTO score by 10 units to demonstrate improvement in overall condition and self-reported functional ability.    Baseline  63 (03/18/2019);    Time  6    Period  Weeks    Status  New    Target Date  04/29/19      PT LONG TERM GOAL #3   Title  Reduce pain with functional activities to equal or less than 1/10 to allow patient  to complete usual activities including ADLs, IADLs, and social engagement with less difficulty.    Baseline  4-5/10 (03/18/2019);    Time  6    Period  Weeks    Status  New    Target Date  04/29/19      PT LONG TERM GOAL #4   Title  Pt will increase strength of by at least 1/2 MMT grade in order to demonstrate improvement in strength and function.    Baseline  (03/18/2019);    Time  6    Period  Weeks    Status  New  Target Date  04/29/19      PT LONG TERM GOAL #5   Title  Complete community, work and/or recreational activities without limitation due to current condition.    Baseline  (03/18/2019);    Time  6    Period  Weeks    Status  New    Target Date  04/29/19      Additional Long Term Goals   Additional Long Term Goals  Yes      PT LONG TERM GOAL #6   Title  Improve tandem stance balance with eyes open on firm surface to equal or greater than 30 seconds each side to demonstrate improved balance for decreased fall risk.    Baseline  R front = 15 s, L front = 5 seconds. (03/18/2019);    Time  6    Period  Weeks    Status  New    Target Date  04/29/19            Plan - 04/03/19 1432    Clinical Impression Statement  Patient tolerated the session well and continue progress toward his goals. Patient demonstrated improved strength and activity endurance with weighted exercises but still presented lack of muscle endurance for higher level balance tasks. Further treatment will focus on neuro-reeducation, balance, and functional mobility. The pt will benefit from skilled PT services to address deficits and return to PLOF and independence, recreational activity and work.    Personal Factors and Comorbidities  Age;Comorbidity 3+    Comorbidities  Macular degeneration, Left carotid stenosis, Spondylolysis, thoracolumbar region, etc.; surgeries include CAROTID STENT 2017    Examination-Activity Limitations  Stairs;Bend;Lift;Carry    Examination-Participation Restrictions   Yard Work;Community Activity;Cleaning;Laundry    Stability/Clinical Decision Making  Evolving/Moderate complexity    Rehab Potential  Good    PT Frequency  2x / week    PT Duration  6 weeks    PT Treatment/Interventions  ADLs/Self Care Home Management;Cryotherapy;Moist Heat;Gait training;Stair training;Functional mobility training;Therapeutic activities;Therapeutic exercise;Balance training;Neuromuscular re-education;Manual techniques;Passive range of motion;Joint Manipulations;Spinal Manipulations;Vestibular;Dry needling;Patient/family education    PT Next Visit Plan  Strengthening, stretching, HEP    PT Home Exercise Plan  Medbridge: [email or web address]    Consulted and Agree with Plan of Care  Patient       Patient will benefit from skilled therapeutic intervention in order to improve the following deficits and impairments:  Abnormal gait, Decreased balance, Decreased mobility, Difficulty walking, Increased muscle spasms, Pain, Impaired flexibility, Decreased activity tolerance, Decreased range of motion, Impaired perceived functional ability, Decreased strength, Hypomobility, Decreased endurance  Visit Diagnosis: Chronic right-sided low back pain, unspecified whether sciatica present  Pain in right hip  Muscle weakness (generalized)  Unsteadiness on feet  History of falling     Problem List Patient Active Problem List   Diagnosis Date Noted  . Abdominal aortic atherosclerosis (HCC) 03/11/2019  . 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 Hyperlipidemia LDL goal <70 03/06/2018  . Elevated PSA 03/06/2018  . Other and unspecified hyperlipidemia 03/04/2018  . 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 Arthropathy, unspecified, other specified sites 03/04/2018  . Medication monitoring encounter 03/04/2018  . Nonexudative age-related macular degeneration, bilateral, early dry stage 11/23/2016  . Pseudophakia of both eyes 11/23/2016   . Left carotid stenosis 08/04/2013  . Sciatica 02/27/2011    Nelly Rout, SPT 04/03/19, 3:17 PM  Luretha Murphy. Ilsa Iha, PT, DPT 04/03/19, 3:28 PM  [facility] [address] [place], [address] Phone: [phone]   Fax:  [phone]  Name: Zachary Walker MRN: [id number] Date of Birth: 1941/06/05

## 2019-04-07 ENCOUNTER — Telehealth: Payer: Self-pay | Admitting: Family Medicine

## 2019-04-07 NOTE — Telephone Encounter (Signed)
Medication Refill - Medication: atorvastatin (LIPITOR) 20 MG tablet  Has the patient contacted their pharmacy? Yes - no refills left (Agent: If no, request that the patient contact the pharmacy for the refill.) (Agent: If yes, when and what did the pharmacy advise?)  Preferred Pharmacy (with phone number or street name):  [id number] [address], [place] [phone] (Phone) [phone] (Fax)   Agent: Please be advised that RX refills may take up to 3 business days. We ask that you follow-up with your pharmacy.

## 2019-04-07 NOTE — Telephone Encounter (Signed)
Notified pt refills already at pharmacy

## 2019-04-08 ENCOUNTER — Ambulatory Visit: Payer: Medicare Other | Admitting: Physical Therapy

## 2019-04-08 ENCOUNTER — Other Ambulatory Visit: Payer: Self-pay

## 2019-04-10 ENCOUNTER — Ambulatory Visit: Payer: Medicare Other | Admitting: Physical Therapy

## 2019-04-15 ENCOUNTER — Ambulatory Visit: Payer: Medicare Other | Attending: Family Medicine | Admitting: Physical Therapy

## 2019-04-15 ENCOUNTER — Other Ambulatory Visit: Payer: Self-pay

## 2019-04-15 DIAGNOSIS — R2681 Unsteadiness on feet: Secondary | ICD-10-CM | POA: Diagnosis present

## 2019-04-15 DIAGNOSIS — M6281 Muscle weakness (generalized): Secondary | ICD-10-CM | POA: Insufficient documentation

## 2019-04-15 DIAGNOSIS — Z9181 History of falling: Secondary | ICD-10-CM | POA: Insufficient documentation

## 2019-04-15 DIAGNOSIS — M545 Low back pain: Secondary | ICD-10-CM | POA: Diagnosis not present

## 2019-04-15 DIAGNOSIS — M25551 Pain in right hip: Secondary | ICD-10-CM | POA: Insufficient documentation

## 2019-04-15 DIAGNOSIS — G8929 Other chronic pain: Secondary | ICD-10-CM | POA: Diagnosis present

## 2019-04-15 NOTE — Therapy (Signed)
[place] [facility] [address] [place], [address] Phone: [phone]   Fax:  [phone]  Physical Therapy Treatment/ Discharge Notes Reporting period: 03/18/2019 - 04/16/2019  Patient Details  Name: Zachary Walker MRN: [id number] Date of Birth: 07-14-1940 Referring Provider (PT): Delsa Grana, PA-C   Encounter Date: 04/15/2019  PT End of Session - 04/16/19 1113    Visit Number  6    Number of Visits  12    Date for PT Re-Evaluation  04/29/19    Authorization Type  [employer] reporting 03/18/2019    Authorization Time Period  certification period: 03/18/2019 - 04/29/2019    Authorization - Visit Number  6    Authorization - Number of Visits  10    PT Start Time  1115    PT Stop Time  1200    PT Time Calculation (min)  45 min    Equipment Utilized During Treatment  Gait belt    Activity Tolerance  Patient tolerated treatment well    Behavior During Therapy  [facility] for tasks assessed/performed       Past Medical History:  Diagnosis Date  . GERD (gastroesophageal reflux disease)   . Hyperlipidemia   . Macular degeneration     Past Surgical History:  Procedure Laterality Date  . CAROTID STENT Left 2014    There were no vitals filed for this visit.  Subjective Assessment - 04/15/19 1126    Subjective  Patient states he has no pain at his hip and he felt his balance has improved with the treatment sessions. Patient has a eye doctor appointment scheduled for next week.    Pertinent History  Relevant past medical history and comorbidities include Macular degeneration, Left carotid stenosis, Spondylolysis, thoracolumbar region, etc.; surgeries include CAROTID STENT 2017, etc., (See more details in above)    Limitations  Lifting;Walking    How long can you walk comfortably?  2 miles a day and pain at the end of the walking    Diagnostic tests  X-ray: There are anterior bridging osteophytes of  the thoracolumbar spineas can be seen with diffuse idiopathic skeletal hyperostosis. Thereis degenerative disease with disc height loss throughout thethoracolumbar spine. There is bilateral facet arthropathy of thethoracolumbar spine most severe at L3-4, L4-5 and L5-S1.    Patient Stated Goals  Get rid of pain    Currently in Pain?  No/denies       TREATMENT: Therapeutic exercise:to centralize symptoms and improve ROM, strength, muscular endurance, and activity tolerance required for successful completion of functional activities. - Treadmill 1.1-1.6 mph. X 63mn For improved extremity mobility, muscular endurance, and activity tolerance; and to induce the analgesic effect of aerobic exercise, stimulate improved joint nutrition, and prepare body structures and systems for following interventions - LE strengthening.  - Squat to chair 3x10 3x10 reps - hip extension 4x10 each side.3s hold on last two set - hip abduction 4x10 reps each 3s hold on last two set  - Core strengthening   - modified UEs push up from bar 3x10 reps  Cuing to improve proper muscle activation and isolation movements from upper trunk   Neuromuscular Re-education:to improve, balance, postural strength, muscle activation patterns, and stabilization strength required for functional activities: - Tandem standing  - 1x30s each position   - 15s for balance at max bilaterally per time - Single leg standing balance2x30 s on each LE. R LE with x 2 times bar touch to keep balance  at each [place]: [email or web address]  URL: [email or web address]  Date: 04/15/2019  Prepared by: Rosita Kea   Exercises  Supine Lower Trunk Rotation - 3 sets - 10 reps - 1x daily - 7x weekly  Prone Press Up - 3 sets - 10 reps - 1x daily - 7x weekly  Squat with Chair Touch - 3 sets - 10 reps - 1x daily  Push-Up on Counter - 3 sets - 10 reps  Standing Hip  Extension with Unilateral Counter Support - 3 sets - 10 reps - 3s hold  Standing Hip Abduction with Counter Support - 3 sets - 10 reps - 3s hold  Standing Hip Adduction with Counter Support - 3 sets - 10 reps - 3s hold  Standing Single Leg Stance with Counter Support - 30s hold  Standing Tandem Balance with Counter Support - 30s hold    Clinical impression:   Patient has attended 6 physical therapy sessions this episode of care and has made good progress towards goals. He has met or nearly met most goals. Patient achieved great pain reduction as well as improved activity tolerance with ADLs. Patient also reported he felt stronger at her LEs and improved balance during standing and walking. Patient states that he has some family visits and medical appointment in the future weeks and unable to attend physical therapy sessions. He has been provided with a long term HEP for long term improvement and prophylaxis against return of symptoms.   After today's session, patient decided to self-discharge from physical therapy and informed his decision through phone call.  Patient is now being discharged from physical therapy at this point due to patient preference.          PT Education - 04/16/19 1112    Education Details  Exercise purpose/form. Self management techniques. long term HEP development    Person(s) Educated  Patient    Methods  Explanation;Demonstration;Tactile cues;Verbal cues;Handout    Comprehension  Verbalized understanding;Returned demonstration;Tactile cues required;Verbal cues required       PT Short Term Goals - 04/01/19 0956      PT SHORT TERM GOAL #1   Title  Be independent with initial home exercise program for self-management of symptoms.    Baseline  initial HEP provided at IE (03/18/2019);    Time  2    Period  Weeks    Status  Achieved    Target Date  04/01/19        PT Long Term Goals - 04/16/19 1117      PT LONG TERM GOAL #1   Title  Be independent with a  long-term home exercise program for self-management of symptoms.    Baseline  initial HEP provided at IE (03/18/2019); updated on 04/15/2019    Time  6    Period  Weeks    Status  Partially Met    Target Date  04/29/19      PT LONG TERM GOAL #2   Title  Demonstrate improved FOTO score by 10 units to demonstrate improvement in overall condition and self-reported functional ability.    Baseline  63 (03/18/2019);    Time  6    Period  Weeks    Status  Unable to assess    Target Date  04/29/19      PT LONG TERM GOAL #3   Title  Reduce pain with functional activities to equal or less than 1/10 to allow patient to complete usual activities including ADLs,  IADLs, and social engagement with less difficulty.    Baseline  4-5/10 (03/18/2019); no pain (04/15/2019)    Time  6    Period  Weeks    Status  Partially Met    Target Date  04/29/19      PT LONG TERM GOAL #4   Title  Pt will increase strength of by at least 1/2 MMT grade in order to demonstrate improvement in strength and function.    Baseline  See IE notes(03/18/2019); Patient self-report feeling stronger (04/15/2019)    Time  6    Period  Weeks    Status  Partially Met    Target Date  04/29/19      PT LONG TERM GOAL #5   Title  Complete community, work and/or recreational activities without limitation due to current condition.    Baseline  See IE notes (03/18/2019); didn't complain any difficulites with ADLs due to hip pain (04/15/2019)    Time  6    Period  Weeks    Status  Achieved    Target Date  04/29/19      PT LONG TERM GOAL #6   Title  Improve tandem stance balance with eyes open on firm surface to equal or greater than 30 seconds each side to demonstrate improved balance for decreased fall risk.    Baseline  R front = 15 s, L front = 5 seconds. (03/18/2019); R front = 15 s, L front = 15 seconds. (04/16/2019);    Time  6    Period  Weeks    Status  Partially Met    Target Date  04/29/19            Plan -  04/16/19 1113    Clinical Impression Statement  Patient has attended 6 physical therapy sessions this episode of care and has made good progress towards goals. He has met or nearly met most goals. Patient achieved great pain reduction as well as improved activity tolerance with ADLs. Patient also reported he felt stronger at her LEs and improved balance during standing and walking. Patient states that he has some family visits and medical appointment in the future weeks and unable to attend physical therapy sessions. He has been provided with a long term HEP for long term improvement and prophylaxis against return of symptoms.  After today's session, patient decided to self-discharge from physical therapy and informed his decision through phone call.  Patient is now being discharged from physical therapy at this point due to patient preference.    Personal Factors and Comorbidities  Age;Comorbidity 3+    Comorbidities  Macular degeneration, Left carotid stenosis, Spondylolysis, thoracolumbar region, etc.; surgeries include CAROTID STENT 2017    Examination-Activity Limitations  Stairs;Bend;Lift;Carry    Examination-Participation Restrictions  Yard Work;Community Activity;Cleaning;Laundry    Stability/Clinical Decision Making  Evolving/Moderate complexity    Rehab Potential  Good    PT Frequency  2x / week    PT Duration  6 weeks    PT Treatment/Interventions  ADLs/Self Care Home Management;Cryotherapy;Moist Heat;Gait training;Stair training;Functional mobility training;Therapeutic activities;Therapeutic exercise;Balance training;Neuromuscular re-education;Manual techniques;Passive range of motion;Joint Manipulations;Spinal Manipulations;Vestibular;Dry needling;Patient/family education    PT Next Visit Plan  Patient is now discharged due to personally satisfied with his improvement.    PT Home Exercise Plan  Medbridge: [email or web address]    Consulted and Agree with Plan of Care  Patient       Patient will  benefit from skilled therapeutic intervention in order to improve the following  deficits and impairments:  Abnormal gait, Decreased balance, Decreased mobility, Difficulty walking, Increased muscle spasms, Pain, Impaired flexibility, Decreased activity tolerance, Decreased range of motion, Impaired perceived functional ability, Decreased strength, Hypomobility, Decreased endurance  Visit Diagnosis: Chronic right-sided low back pain, unspecified whether sciatica present  Pain in right hip  Muscle weakness (generalized)  Unsteadiness on feet  History of falling     Problem List Patient Active Problem List   Diagnosis Date Noted  . Abdominal aortic atherosclerosis ([place]) 03/11/2019  . 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 Hyperlipidemia LDL goal <70 03/06/2018  . Elevated PSA 03/06/2018  . Other and unspecified hyperlipidemia 03/04/2018  . 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 Arthropathy, unspecified, other specified sites 03/04/2018  . Medication monitoring encounter 03/04/2018  . Nonexudative age-related macular degeneration, bilateral, early dry stage 11/23/2016  . 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 Left carotid stenosis 08/04/2013  . Sciatica 02/27/2011    Sherrilyn Rist, SPT 04/16/19, 11:54 AM  Everlean Alstrom. Graylon Good, PT, DPT 04/16/19, 11:54 AM   [place] [facility] [address] [place], [address] Phone: [phone]   Fax:  [phone]  Name: HOLMAN BONSIGNORE MRN: [id number] Date of Birth: [birth year]

## 2019-04-16 ENCOUNTER — Encounter: Payer: Self-pay | Admitting: Physical Therapy

## 2019-04-16 NOTE — Progress Notes (Signed)
Thank you Clarise Cruz!  Appreciate your work for this patient :)  Delsa Grana, PA-C

## 2019-04-17 ENCOUNTER — Ambulatory Visit: Payer: Medicare Other | Admitting: Physical Therapy

## 2019-04-22 ENCOUNTER — Encounter: Payer: Medicare Other | Admitting: Physical Therapy

## 2019-04-24 ENCOUNTER — Encounter: Payer: Medicare Other | Admitting: Physical Therapy

## 2019-04-29 ENCOUNTER — Encounter: Payer: Medicare Other | Admitting: Physical Therapy

## 2019-05-06 ENCOUNTER — Encounter: Payer: Medicare Other | Admitting: Physical Therapy

## 2019-05-08 ENCOUNTER — Encounter: Payer: Medicare Other | Admitting: Physical Therapy

## 2019-06-29 ENCOUNTER — Other Ambulatory Visit: Payer: Self-pay | Admitting: Family Medicine

## 2019-06-30 NOTE — Telephone Encounter (Signed)
lvm to sch appt °

## 2019-06-30 NOTE — Telephone Encounter (Signed)
Patient needs f/u appt.  1 month supply refilled of chol. med

## 2019-07-30 ENCOUNTER — Other Ambulatory Visit: Payer: Self-pay | Admitting: Family Medicine

## 2019-07-30 NOTE — Telephone Encounter (Signed)
Refill request for general medication.  Last office visit: 10/20  No follow-ups on file.

## 2019-07-31 NOTE — Telephone Encounter (Signed)
appt scheduled

## 2019-08-25 ENCOUNTER — Encounter: Payer: Self-pay | Admitting: Family Medicine

## 2019-08-25 ENCOUNTER — Ambulatory Visit (INDEPENDENT_AMBULATORY_CARE_PROVIDER_SITE_OTHER): Payer: Medicare PPO | Admitting: Family Medicine

## 2019-08-25 ENCOUNTER — Other Ambulatory Visit: Payer: Self-pay

## 2019-08-25 VITALS — Ht 67.5 in | Wt 166.0 lb

## 2019-08-25 DIAGNOSIS — I700 Atherosclerosis of aorta: Secondary | ICD-10-CM | POA: Diagnosis not present

## 2019-08-25 DIAGNOSIS — M545 Low back pain, unspecified: Secondary | ICD-10-CM

## 2019-08-25 DIAGNOSIS — Z5181 Encounter for therapeutic drug level monitoring: Secondary | ICD-10-CM

## 2019-08-25 DIAGNOSIS — Z9181 History of falling: Secondary | ICD-10-CM | POA: Insufficient documentation

## 2019-08-25 DIAGNOSIS — E785 Hyperlipidemia, unspecified: Secondary | ICD-10-CM | POA: Diagnosis not present

## 2019-08-25 DIAGNOSIS — R030 Elevated blood-pressure reading, without diagnosis of hypertension: Secondary | ICD-10-CM

## 2019-08-25 DIAGNOSIS — R296 Repeated falls: Secondary | ICD-10-CM | POA: Diagnosis not present

## 2019-08-25 NOTE — Patient Instructions (Signed)
Continue your lipitor 20 mg daily, healthy diet and exercising.  Please come get your labs done in the next month  We will see you for your next follow up in 6 months  If your blood pressure goes above 140/90 we would want to see you sooner to check on that.

## 2019-08-25 NOTE — Progress Notes (Signed)
Name: Zachary Walker   MRN: [id number]    DOB: [birth year]   Date:08/25/2019       Progress Note  Subjective:    Chief Complaint  Chief Complaint  Patient presents with  . Follow-up  . Hyperlipidemia    I connected with  [name] on 08/25/19 at  1:40 PM EDT by telephone and verified that I am speaking with the correct person using two identifiers.  I discussed the limitations, risks, security and privacy concerns of performing an evaluation and management service by telephone and the availability of in person appointments. Staff also discussed with the patient that there may be a patient responsible charge related to this service. Patient Location: home Provider Location: [facility] Additional Individuals present: none  HPI  Hyperlipidemia: Current Medication Regimen:  lipitor 20 mg daily, good med compliance, no concerns or side effects Last Lipids: Lab Results  Component Value Date   CHOL 127 10/22/2018   HDL 38 (L) 10/22/2018   LDLCALC 68 10/22/2018   TRIG 129 10/22/2018   CHOLHDL 3.3 10/22/2018  - Current Diet:  Healthy diet, low salt - Denies: Chest pain, shortness of breath, myalgias. - Documented aortic atherosclerosis? Yes - Risk factors for atherosclerosis: hypercholesterolemia  BP elevation without diagnosis of HTN: Previously noted high BP about 7-8 months ago at his clinical trial, he followed up with PCP at that time.  He has not had any high readings at his clinical trial, he does monitor at home his daughter is also a Marine scientist and helps him check it, they have had no concerning readings, patient has stayed active walking and working on diet and lifestyle he has a low-salt diet as well.  Blood pressures typically run 127-130/82. He denies any headache palpitations chest pain or pressure, exertional symptoms, orthopnea, PND, near syncope  PSA elevated: Lab Results  Component Value Date   PSA 7.5 (H) 10/22/2018   PSA 7.6 (H) 03/04/2018  Patient did  consult with a specialist for elevated PSA.  He states that he saw [employer], but he cannot remember the provider's name.-Patient states that the specialist was not concerned with PSA numbers, states it was normal to be at that level for his age and he could recheck every year if he wanted to or not.  Pt does not wish to do any further monitoring does not wish for me to order PSA.  He is has no urinary symptoms, unintentional weight loss  Last visit when I first met the patient he had come in for a fall and was complaining of back pain and he had some radiation to his leg he was referred to physical therapy last October.  He states that he completed physical therapy he continues to do his home exercises and stretches.  He states that he slipped and fell on a rock he has not had any trouble with his gait balance or mobility, and he denies any subsequent falls since that time.  He reports that PT did not not have any concerns either.  And here reports feeling much better he also reports a very positive experience with the physical therapist     Patient Active Problem List   Diagnosis Date Noted  . At high risk for falls 08/25/2019  . Multiple falls 08/25/2019  . Abdominal aortic atherosclerosis ([place]) 03/11/2019  . 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 Hyperlipidemia LDL goal <70 03/06/2018  . Elevated PSA 03/06/2018  . Other and unspecified hyperlipidemia 03/04/2018  .  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 Arthropathy, unspecified, other specified sites 03/04/2018  . Medication monitoring encounter 03/04/2018  . Nonexudative age-related macular degeneration, bilateral, early dry stage 11/23/2016  . 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 Left carotid stenosis 08/04/2013  . Sciatica 02/27/2011    Current Outpatient Medications:  .  acetaminophen (TYLENOL) 650 MG CR tablet, Take 1,300 mg by mouth every 12 (twelve) hours as  needed for pain., Disp: , Rfl:  .  aspirin EC 81 MG tablet, Take 1 tablet (81 mg total) by mouth daily., Disp: , Rfl:  .  atorvastatin (LIPITOR) 20 MG tablet, TAKE 1 TABLET BY MOUTH EVERYDAY AT BEDTIME, Disp: 90 tablet, Rfl: 3 .  Multiple Vitamin (MULTIVITAMIN) tablet, Take 1 tablet by mouth daily., Disp: , Rfl:  .  Multiple Vitamins-Minerals (PRESERVISION AREDS) CAPS, One by mouth twice a day, Disp: , Rfl: 0 .  naproxen (NAPROSYN) 500 MG tablet, Take 1 tablet (500 mg total) by mouth 2 (two) times daily as needed., Disp: 30 tablet, Rfl: 0 .  tizanidine (ZANAFLEX) 2 MG capsule, Take 1 capsule (2 mg total) by mouth 3 (three) times daily as needed for muscle spasms., Disp: 30 capsule, Rfl: 0 No Known Allergies  Past Surgical History:  Procedure Laterality Date  . CAROTID STENT Left 2014   Family History  Problem Relation Age of Onset  . Macular degeneration Mother   . Emphysema Mother   . Heart disease Brother    Social History   Socioeconomic History  . Marital status: Married    Spouse name: Rod Holler  . Number of children: 2  . Years of education: 26  . Highest education level: Some college, no degree  Occupational History    Comment: Chief Financial Officer at [employer] Use  . Smoking status: Former Smoker    Types: Cigarettes    Quit date: 1978    Years since quitting: 43.2  . Smokeless tobacco: Former Systems developer    Types: Chew    Quit date: 32  . Tobacco comment: un aware of how many or how long he smoked  Substance and Sexual Activity  . Alcohol use: Yes    Alcohol/week: 1.0 standard drinks    Types: 1 Cans of beer per week    Comment: 5 a month  . Drug use: Never  . Sexual activity: Not Currently  Other Topics Concern  . Not on file  Social History Narrative  . Not on file   Social Determinants of Health   Financial Resource Strain:   . Difficulty of Paying Living Expenses:   Food Insecurity:   . Worried About Charity fundraiser in the Last Year:   . Arboriculturist in the  Last Year:   Transportation Needs:   . Film/video editor (Medical):   Marland Kitchen Lack of Transportation (Non-Medical):   Physical Activity: Sufficiently Active  . Days of Exercise per Week: 5 days  . Minutes of Exercise per Session: 40 min  Stress:   . Feeling of Stress :   Social Connections:   . Frequency of Communication with Friends and Family:   . Frequency of Social Gatherings with Friends and Family:   . Attends Religious Services:   . Active Member of Clubs or Organizations:   . Attends Archivist Meetings:   Marland Kitchen Marital Status:   Intimate Partner Violence:   . Fear of Current or Ex-Partner:   . Emotionally Abused:   .  Physically Abused:   . Sexually Abused:     Chart Review Today: I personally reviewed active problem list, medication list, allergies, family history, social history, health maintenance, notes from last encounter, lab results, imaging with the patient/caregiver today.  Review of Systems 10 Systems reviewed and are negative for acute change except as noted in the HPI.   Objective:    Virtual encounter, vitals limited, only able to obtain the following: Today's Vitals   08/25/19 1328  Weight: 166 lb (75.3 kg)  Height: 5' 7.5" (1.715 m)   Body mass index is 25.62 kg/m. Nursing Note and Vital Signs reviewed.  Physical Exam Patient's phonation clear, answering questions appropriately Available vital signs and nursing notes reviewed PE limited by telephone encounter   PHQ2/9: Depression screen [facility] 2/9 08/25/2019 03/10/2019 01/29/2019 11/28/2018 10/22/2018  Decreased Interest 0 0 0 0 0  Down, Depressed, Hopeless 0 0 0 0 0  PHQ - 2 Score 0 0 0 0 0  Altered sleeping 0 0 - 0 0  Tired, decreased energy 0 0 - 0 0  Change in appetite 0 0 - 0 0  Feeling bad or failure about yourself  0 0 - 0 0  Trouble concentrating 0 0 - 0 0  Moving slowly or fidgety/restless 0 0 - 0 0  Suicidal thoughts 0 0 - 0 0  PHQ-9 Score 0 0 - 0 0  Difficult doing  work/chores Not difficult at all Not difficult at all - Not difficult at all Not difficult at all   PHQ-2/9 Result is neg  Fall Risk: Fall Risk  08/25/2019 08/25/2019 03/10/2019 01/29/2019 11/28/2018  Falls in the past year? 1 1 0 0 0  Number falls in past yr: 0 1 0 0 0  Injury with Fall? 1 1 0 0 0  Risk for fall due to : History of fall(s) History of fall(s);Impaired balance/gait - - -  Follow up Falls evaluation completed;Education provided;Falls prevention discussed Falls evaluation completed;Education provided;Falls prevention discussed - - -  Comment - he went to PT, is improved, no subsequent falls - - -   Updated fall screening, last visit done with pt was for a fall - last entered screening was erroneous - updated to reflect  Assessment and Plan:     ICD-10-CM   1. Hyperlipidemia LDL goal <70  T70.1 COMPLETE METABOLIC PANEL WITH GFR    Lipid panel   Hx of being well controlled with lipitor 20 mg daily, still compliant, no SE or concerns, due to FLP and CMP  2. Abdominal aortic atherosclerosis ([facility])  I70.0    on statin, monitoring  3. Acute right-sided low back pain without sciatica  M54.5    has improved with physical therapy and was released/discharged and he is still doing his home exercises, feels great, he really enjoyed the physical therapists  4. Multiple falls  R29.6    no subsequent falls, mechanical fall, pt denies problem with gait/mobility, finished with PT, moderate fall risk with hx  5. Elevated blood pressure reading  R03.0    monitoring BP at home continues to be 120-130/80, no higher readings, no sx, continue lifestyle and diet efforts, f/up if >140/90  6. Medication monitoring encounter  X79.39 COMPLETE METABOLIC PANEL WITH GFR    Lipid panel     I discussed the assessment and treatment plan with the patient. The patient was provided an opportunity to ask questions and all were answered. The patient agreed with the plan and demonstrated an understanding  of the  instructions.   The patient was advised to call back or seek an in-person evaluation if the symptoms worsen or if the condition fails to improve as anticipated.  I provided 20+ minutes of non-face-to-face time during this encounter. More than 14 min were spent directly on the phone with the pt,   Remainder of time involved but was not limited to reviewing chart (recent and pertinent OV notes and labs), documentation in EMR, and coordinating care and treatment plan.   Delsa Grana, PA-C 08/25/19 2:06 PM

## 2019-09-04 LAB — COMPLETE METABOLIC PANEL WITH GFR
AG Ratio: 1.5 (calc) (ref 1.0–2.5)
ALT: 14 U/L (ref 9–46)
AST: 16 U/L (ref 10–35)
Albumin: 4.2 g/dL (ref 3.6–5.1)
Alkaline phosphatase (APISO): 86 U/L (ref 35–144)
BUN/Creatinine Ratio: 13 (calc) (ref 6–22)
BUN: 15 mg/dL (ref 7–25)
CO2: 29 mmol/L (ref 20–32)
Calcium: 9.4 mg/dL (ref 8.6–10.3)
Chloride: 104 mmol/L (ref 98–110)
Creat: 1.2 mg/dL — ABNORMAL HIGH (ref 0.70–1.18)
GFR, Est African American: 67 mL/min/{1.73_m2} (ref >=60)
GFR, Est Non African American: 58 mL/min/{1.73_m2} — ABNORMAL LOW (ref >=60)
Globulin: 2.8 g/dL (calc) (ref 1.9–3.7)
Glucose, Bld: 141 mg/dL — ABNORMAL HIGH (ref 65–99)
Potassium: 5.1 mmol/L (ref 3.5–5.3)
Sodium: 141 mmol/L (ref 135–146)
Total Bilirubin: 0.7 mg/dL (ref 0.2–1.2)
Total Protein: 7 g/dL (ref 6.1–8.1)

## 2019-09-04 LAB — LIPID PANEL
Cholesterol: 126 mg/dL (ref <200)
HDL: 39 mg/dL — ABNORMAL LOW (ref >=40)
LDL Cholesterol (Calc): 59 mg/dL (calc)
Non-HDL Cholesterol (Calc): 87 mg/dL (calc) (ref <130)
Total CHOL/HDL Ratio: 3.2 (calc) (ref <5.0)
Triglycerides: 214 mg/dL — ABNORMAL HIGH (ref <150)

## 2019-09-10 ENCOUNTER — Telehealth: Payer: Self-pay

## 2019-09-10 DIAGNOSIS — N179 Acute kidney failure, unspecified: Secondary | ICD-10-CM

## 2019-09-10 NOTE — Telephone Encounter (Signed)
-----   Message from Danelle Berry, [place] sent at 09/10/2019  2:03 PM EDT ----- Please notify pt of labwork  Patient had a decline in his kidney function, there can be several reasons for this -I would like him to come into the office in the next week or 2 to recheck the renal function and check his blood pressure. Patient should avoid NSAIDs, be sure to stay well-hydrated  Asher Muir please order BMP with GFR for diagnosis of AKI - and we will need to put a note on the labs that we need VS when he comes.

## 2019-09-12 ENCOUNTER — Other Ambulatory Visit: Payer: Self-pay

## 2019-09-12 ENCOUNTER — Ambulatory Visit: Payer: Medicare PPO | Admitting: Family Medicine

## 2019-09-12 ENCOUNTER — Encounter: Payer: Self-pay | Admitting: Family Medicine

## 2019-09-12 VITALS — BP 140/80 | HR 94 | Temp 97.1°F | Resp 16 | Ht 67.5 in | Wt 173.2 lb

## 2019-09-12 DIAGNOSIS — L989 Disorder of the skin and subcutaneous tissue, unspecified: Secondary | ICD-10-CM | POA: Diagnosis not present

## 2019-09-12 NOTE — Progress Notes (Signed)
Name: Zachary Walker   MRN: [id number]    DOB: 05/31/1941   Date:09/12/2019       Progress Note  Subjective  Chief Complaint  Chief Complaint  Patient presents with  . Wound Check    He has a wound on his face from shaving that he has nicked twice and it want heal.    HPI  Lesion face: he states he cut his face while shaving his face two weeks ago. There was a bump underneath and he shave over it, not healing, he states it has been dry, like a scab but not healing . He has a history of skin cancer but not sure of the type. He made an appointment with dermatologist but is not until May 2021   Patient Active Problem List   Diagnosis Date Noted  . At high risk for falls 08/25/2019  . Multiple falls 08/25/2019  . Abdominal aortic atherosclerosis ([place]) 03/11/2019  . 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 Hyperlipidemia LDL goal <70 03/06/2018  . Elevated PSA 03/06/2018  . Other and unspecified hyperlipidemia 03/04/2018  . 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 Arthropathy, unspecified, other specified sites 03/04/2018  . Medication monitoring encounter 03/04/2018  . Nonexudative age-related macular degeneration, bilateral, early dry stage 11/23/2016  . 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 Left carotid stenosis 08/04/2013  . Sciatica 02/27/2011    Past Surgical History:  Procedure Laterality Date  . CAROTID STENT Left 2014    Family History  Problem Relation Age of Onset  . Macular degeneration Mother   . Emphysema Mother   . Heart disease Brother     Social History   Tobacco Use  . Smoking status: Former Smoker    Types: Cigarettes    Quit date: 1978    Years since quitting: 43.2  . Smokeless tobacco: Former Systems developer    Types: Chew    Quit date: 62  . Tobacco comment: un aware of how many or how long he smoked  Substance Use Topics  . Alcohol use: Yes    Alcohol/week: 1.0 standard drinks    Types:  1 Cans of beer per week    Comment: 5 a month     Current Outpatient Medications:  .  acetaminophen (TYLENOL) 650 MG CR tablet, Take 1,300 mg by mouth every 12 (twelve) hours as needed for pain., Disp: , Rfl:  .  aspirin EC 81 MG tablet, Take 1 tablet (81 mg total) by mouth daily., Disp: , Rfl:  .  atorvastatin (LIPITOR) 20 MG tablet, TAKE 1 TABLET BY MOUTH EVERYDAY AT BEDTIME, Disp: 90 tablet, Rfl: 3 .  Multiple Vitamin (MULTIVITAMIN) tablet, Take 1 tablet by mouth daily., Disp: , Rfl:  .  Multiple Vitamins-Minerals (PRESERVISION AREDS) CAPS, One by mouth twice a day, Disp: , Rfl: 0 .  naproxen (NAPROSYN) 500 MG tablet, Take 1 tablet (500 mg total) by mouth 2 (two) times daily as needed. (Patient not taking: Reported on 09/12/2019), Disp: 30 tablet, Rfl: 0 .  tizanidine (ZANAFLEX) 2 MG capsule, Take 1 capsule (2 mg total) by mouth 3 (three) times daily as needed for muscle spasms. (Patient not taking: Reported on 09/12/2019), Disp: 30 capsule, Rfl: 0  No Known Allergies  I personally reviewed active problem list, medication list, allergies, family history with the patient/caregiver today.   ROS  Ten systems reviewed and is negative except as mentioned in HPI  Objective  Vitals:   09/12/19 0825  BP: 140/80  Pulse: 94  Resp: 16  Temp: (!) 97.1 F (36.2 C)  TempSrc: Temporal  SpO2: 97%  Weight: 173 lb 3.2 oz (78.6 kg)  Height: 5' 7.5" (1.715 m)    Body mass index is 26.73 kg/m.  Physical Exam  Constitutional: Patient appears well-developed and well-nourished.  No distress.  HEENT: head atraumatic, normocephalic, pupils equal and reactive to light Cardiovascular: Normal rate, regular rhythm and normal heart sounds.  No murmur heard. No BLE edema. Skin: raised lesion on right cheek , irregular borders, some dry blood and scab formation. No tenderness  Pulmonary/Chest: Effort normal and breath sounds normal. No respiratory distress. Abdominal: Soft.  There is no  tenderness. Psychiatric: Patient has a normal mood and affect. behavior is normal. Judgment and thought content normal.  Recent Results (from the past 2160 hour(s))  COMPLETE METABOLIC PANEL WITH GFR     Status: Abnormal   Collection Time: 09/04/19  9:33 AM  Result Value Ref Range   Glucose, Bld 141 (H) 65 - 99 mg/dL    Comment: .            Fasting reference interval . For someone without known diabetes, a glucose value >125 mg/dL indicates that they may have diabetes and this should be confirmed with a follow-up test. .    BUN 15 7 - 25 mg/dL   Creat 6.28 (H) 3.66 - 1.18 mg/dL    Comment: For patients >92 years of age, the reference limit for Creatinine is approximately 13% higher for people identified as African-American. .    GFR, Est Non African American 58 (L) > OR = 60 mL/min/1.57m2   GFR, Est African American 67 > OR = 60 mL/min/1.97m2   BUN/Creatinine Ratio 13 6 - 22 (calc)   Sodium 141 135 - 146 mmol/L   Potassium 5.1 3.5 - 5.3 mmol/L   Chloride 104 98 - 110 mmol/L   CO2 29 20 - 32 mmol/L   Calcium 9.4 8.6 - 10.3 mg/dL   Total Protein 7.0 6.1 - 8.1 g/dL   Albumin 4.2 3.6 - 5.1 g/dL   Globulin 2.8 1.9 - 3.7 g/dL (calc)   AG Ratio 1.5 1.0 - 2.5 (calc)   Total Bilirubin 0.7 0.2 - 1.2 mg/dL   Alkaline phosphatase (APISO) 86 35 - 144 U/L   AST 16 10 - 35 U/L   ALT 14 9 - 46 U/L  Lipid panel     Status: Abnormal   Collection Time: 09/04/19  9:33 AM  Result Value Ref Range   Cholesterol 126 <200 mg/dL   HDL 39 (L) > OR = 40 mg/dL   Triglycerides 294 (H) <150 mg/dL    Comment: . If a non-fasting specimen was collected, consider repeat triglyceride testing on a fasting specimen if clinically indicated.  Perry Mount et al. J. of Clin. Lipidol. 2015;9:129-169. Marland Kitchen    LDL Cholesterol (Calc) 59 mg/dL (calc)    Comment: Reference range: <100 . Desirable range <100 mg/dL for primary prevention;   <70 mg/dL for patients with CHD or diabetic patients  with > or = 2 CHD  risk factors. Marland Kitchen LDL-C is now calculated using the Martin-Hopkins  calculation, which is a validated novel method providing  better accuracy than the Friedewald equation in the  estimation of LDL-C.  Horald Pollen et al. Lenox Ahr. 7654;650(35): 2061-2068  (http://education.[facility].com/faq/FAQ164)    Total CHOL/HDL Ratio 3.2 <5.0 (calc)   Non-HDL Cholesterol (Calc) 87 <465 mg/dL (calc)    Comment: For patients  with diabetes plus 1 major ASCVD risk  factor, treating to a non-HDL-C goal of <100 mg/dL  (LDL-C of <41 mg/dL) is considered a therapeutic  option.       PHQ2/9: Depression screen [facility] 2/9 09/12/2019 08/25/2019 03/10/2019 01/29/2019 11/28/2018  Decreased Interest 0 0 0 0 0  Down, Depressed, Hopeless 0 0 0 0 0  PHQ - 2 Score 0 0 0 0 0  Altered sleeping 0 0 0 - 0  Tired, decreased energy 0 0 0 - 0  Change in appetite 0 0 0 - 0  Feeling bad or failure about yourself  0 0 0 - 0  Trouble concentrating 0 0 0 - 0  Moving slowly or fidgety/restless 0 0 0 - 0  Suicidal thoughts 0 0 0 - 0  PHQ-9 Score 0 0 0 - 0  Difficult doing work/chores - Not difficult at all Not difficult at all - Not difficult at all    phq 9 is negative   Fall Risk: Fall Risk  09/12/2019 08/25/2019 08/25/2019 03/10/2019 01/29/2019  Falls in the past year? 0 1 1 0 0  Number falls in past yr: 0 0 1 0 0  Injury with Fall? 0 1 1 0 0  Risk for fall due to : - History of fall(s) History of fall(s);Impaired balance/gait - -  Follow up - Falls evaluation completed;Education provided;Falls prevention discussed Falls evaluation completed;Education provided;Falls prevention discussed - -  Comment - - he went to PT, is improved, no subsequent falls - -      Functional Status Survey: Is the patient deaf or have difficulty hearing?: No Does the patient have difficulty seeing, even when wearing glasses/contacts?: No Does the patient have difficulty concentrating, remembering, or making decisions?: No Does the patient have  difficulty walking or climbing stairs?: No Does the patient have difficulty dressing or bathing?: No Does the patient have difficulty doing errands alone such as visiting a doctor's office or shopping?: No    Assessment & Plan   1. Skin lesion of face  - Ambulatory referral to Dermatology

## 2019-09-30 ENCOUNTER — Ambulatory Visit: Payer: Medicare PPO

## 2019-10-02 ENCOUNTER — Ambulatory Visit (INDEPENDENT_AMBULATORY_CARE_PROVIDER_SITE_OTHER): Payer: Medicare PPO

## 2019-10-02 DIAGNOSIS — Z0000 Encounter for general adult medical examination without abnormal findings: Secondary | ICD-10-CM | POA: Diagnosis not present

## 2019-10-02 DIAGNOSIS — Z87891 Personal history of nicotine dependence: Secondary | ICD-10-CM

## 2019-10-02 NOTE — Patient Instructions (Signed)
Zachary Walker , Thank you for taking time to come for your Medicare Wellness Visit. I appreciate your ongoing commitment to your health goals. Please review the following plan we discussed and let me know if I can assist you in the future.   Screening recommendations/referrals: Colonoscopy: no longer required Recommended yearly ophthalmology/optometry visit for glaucoma screening and checkup Recommended yearly dental visit for hygiene and checkup  Vaccinations: Influenza vaccine: done 02/24/19 Pneumococcal vaccine: done 03/04/18 Tdap vaccine: done 08/18/14 Shingles vaccine: Shingrix discussed. Please contact your pharmacy for coverage information.  Covid-19: done 06/12/19 & 07/03/19  Advanced directives: Please bring a copy of your health care power of attorney and living will to the office at your convenience.  Conditions/risks identified: Keep up the great work!  Next appointment: Please follow up in one year for your Medicare Annual Wellness visit.    Preventive Care 55 Years and Older, Male Preventive care refers to lifestyle choices and visits with your health care provider that can promote health and wellness. What does preventive care include?  A yearly physical exam. This is also called an annual well check.  Dental exams once or twice a year.  Routine eye exams. Ask your health care provider how often you should have your eyes checked.  Personal lifestyle choices, including:  Daily care of your teeth and gums.  Regular physical activity.  Eating a healthy diet.  Avoiding tobacco and drug use.  Limiting alcohol use.  Practicing safe sex.  Taking low doses of aspirin every day.  Taking vitamin and mineral supplements as recommended by your health care provider. What happens during an annual well check? The services and screenings done by your health care provider during your annual well check will depend on your age, overall health, lifestyle risk factors, and family  history of disease. Counseling  Your health care provider may ask you questions about your:  Alcohol use.  Tobacco use.  Drug use.  Emotional well-being.  Home and relationship well-being.  Sexual activity.  Eating habits.  History of falls.  Memory and ability to understand (cognition).  Work and work Astronomer. Screening  You may have the following tests or measurements:  Height, weight, and BMI.  Blood pressure.  Lipid and cholesterol levels. These may be checked every 5 years, or more frequently if you are over 68 years old.  Skin check.  Lung cancer screening. You may have this screening every year starting at age 11 if you have a 30-pack-year history of smoking and currently smoke or have quit within the past 15 years.  Fecal occult blood test (FOBT) of the stool. You may have this test every year starting at age 79.  Flexible sigmoidoscopy or colonoscopy. You may have a sigmoidoscopy every 5 years or a colonoscopy every 10 years starting at age 68.  Prostate cancer screening. Recommendations will vary depending on your family history and other risks.  Hepatitis C blood test.  Hepatitis B blood test.  Sexually transmitted disease (STD) testing.  Diabetes screening. This is done by checking your blood sugar (glucose) after you have not eaten for a while (fasting). You may have this done every 1-3 years.  Abdominal aortic aneurysm (AAA) screening. You may need this if you are a current or former smoker.  Osteoporosis. You may be screened starting at age 12 if you are at high risk. Talk with your health care provider about your test results, treatment options, and if necessary, the need for more tests. Vaccines  Your  health care provider may recommend certain vaccines, such as:  Influenza vaccine. This is recommended every year.  Tetanus, diphtheria, and acellular pertussis (Tdap, Td) vaccine. You may need a Td booster every 10 years.  Zoster vaccine.  You may need this after age 72.  Pneumococcal 13-valent conjugate (PCV13) vaccine. One dose is recommended after age 31.  Pneumococcal polysaccharide (PPSV23) vaccine. One dose is recommended after age 69. Talk to your health care provider about which screenings and vaccines you need and how often you need them. This information is not intended to replace advice given to you by your health care provider. Make sure you discuss any questions you have with your health care provider. Document Released: 06/18/2015 Document Revised: 02/09/2016 Document Reviewed: 03/23/2015 Elsevier Interactive Patient Education  2017 [place] Prevention in the Home Falls can cause injuries. They can happen to people of all ages. There are many things you can do to make your home safe and to help prevent falls. What can I do on the outside of my home?  Regularly fix the edges of walkways and driveways and fix any cracks.  Remove anything that might make you trip as you walk through a door, such as a raised step or threshold.  Trim any bushes or trees on the path to your home.  Use bright outdoor lighting.  Clear any walking paths of anything that might make someone trip, such as rocks or tools.  Regularly check to see if handrails are loose or broken. Make sure that both sides of any steps have handrails.  Any raised decks and porches should have guardrails on the edges.  Have any leaves, snow, or ice cleared regularly.  Use sand or salt on walking paths during winter.  Clean up any spills in your garage right away. This includes oil or grease spills. What can I do in the bathroom?  Use night lights.  Install grab bars by the toilet and in the tub and shower. Do not use towel bars as grab bars.  Use non-skid mats or decals in the tub or shower.  If you need to sit down in the shower, use a plastic, non-slip stool.  Keep the floor dry. Clean up any water that spills on the floor as soon  as it happens.  Remove soap buildup in the tub or shower regularly.  Attach bath mats securely with double-sided non-slip rug tape.  Do not have throw rugs and other things on the floor that can make you trip. What can I do in the bedroom?  Use night lights.  Make sure that you have a light by your bed that is easy to reach.  Do not use any sheets or blankets that are too big for your bed. They should not hang down onto the floor.  Have a firm chair that has side arms. You can use this for support while you get dressed.  Do not have throw rugs and other things on the floor that can make you trip. What can I do in the kitchen?  Clean up any spills right away.  Avoid walking on wet floors.  Keep items that you use a lot in easy-to-reach places.  If you need to reach something above you, use a strong step stool that has a grab bar.  Keep electrical cords out of the way.  Do not use floor polish or wax that makes floors slippery. If you must use wax, use non-skid floor wax.  Do not  have throw rugs and other things on the floor that can make you trip. What can I do with my stairs?  Do not leave any items on the stairs.  Make sure that there are handrails on both sides of the stairs and use them. Fix handrails that are broken or loose. Make sure that handrails are as long as the stairways.  Check any carpeting to make sure that it is firmly attached to the stairs. Fix any carpet that is loose or worn.  Avoid having throw rugs at the top or bottom of the stairs. If you do have throw rugs, attach them to the floor with carpet tape.  Make sure that you have a light switch at the top of the stairs and the bottom of the stairs. If you do not have them, ask someone to add them for you. What else can I do to help prevent falls?  Wear shoes that:  Do not have high heels.  Have rubber bottoms.  Are comfortable and fit you well.  Are closed at the toe. Do not wear sandals.  If  you use a stepladder:  Make sure that it is fully opened. Do not climb a closed stepladder.  Make sure that both sides of the stepladder are locked into place.  Ask someone to hold it for you, if possible.  Clearly mark and make sure that you can see:  Any grab bars or handrails.  First and last steps.  Where the edge of each step is.  Use tools that help you move around (mobility aids) if they are needed. These include:  Canes.  Walkers.  Scooters.  Crutches.  Turn on the lights when you go into a dark area. Replace any light bulbs as soon as they burn out.  Set up your furniture so you have a clear path. Avoid moving your furniture around.  If any of your floors are uneven, fix them.  If there are any pets around you, be aware of where they are.  Review your medicines with your doctor. Some medicines can make you feel dizzy. This can increase your chance of falling. Ask your doctor what other things that you can do to help prevent falls. This information is not intended to replace advice given to you by your health care provider. Make sure you discuss any questions you have with your health care provider. Document Released: 03/18/2009 Document Revised: 10/28/2015 Document Reviewed: 06/26/2014 Elsevier Interactive Patient Education  2017 [employer].

## 2019-10-02 NOTE — Progress Notes (Signed)
Subjective:   Zachary Walker is a 79 y.o. male who presents for Medicare Annual/Subsequent preventive examination.  Virtual Visit via Telephone Note  I connected with  Zachary Walker on 10/02/19 at  1:30 PM EDT by telephone and verified that I am speaking with the correct person using two identifiers.  Medicare Annual Wellness visit completed telephonically due to Covid-19 pandemic.   Location: Patient: home Provider: office   I discussed the limitations, risks, security and privacy concerns of performing an evaluation and management service by telephone and the availability of in person appointments. The patient expressed understanding and agreed to proceed.  Unable to perform video visit due to patient does not have video capability.   Some vital signs may be absent or patient reported.   Reather Littler, LPN    Review of Systems:   Cardiac Risk Factors include: advanced age (>17men, >68 women);male gender;dyslipidemia     Objective:    Vitals: There were no vitals taken for this visit.  There is no height or weight on file to calculate BMI.  Advanced Directives 10/02/2019 03/18/2019 09/26/2018  Does Patient Have a Medical Advance Directive? Yes Yes Yes  Type of Estate agent of [place];Living will Living will Living will;Healthcare Power of Attorney  Does patient want to make changes to medical advance directive? - No - Patient declined -  Copy of Healthcare Power of Attorney in Chart? Yes - validated most recent copy scanned in chart (See row information) - No - copy requested    Tobacco Social History   Tobacco Use  Smoking Status Former Smoker  . Types: Cigarettes  . Quit date: 73  . Years since quitting: 43.3  Smokeless Tobacco Former Neurosurgeon  . Types: Chew  . Quit date: 26  Tobacco Comment   un aware of how many or how long he smoked     Counseling given: Not Answered Comment: un aware of how many or how long he smoked   Clinical  Intake:  Pre-visit preparation completed: Yes  Pain : No/denies pain     Nutritional Risks: None  How often do you need to have someone help you when you read instructions, pamphlets, or other written materials from your doctor or pharmacy?: 1 - Never  Interpreter Needed?: No  Information entered by :: Reather Littler LPN  Past Medical History:  Diagnosis Date  . GERD (gastroesophageal reflux disease)   . Hyperlipidemia   . Macular degeneration    Past Surgical History:  Procedure Laterality Date  . CAROTID STENT Left 2014   Family History  Problem Relation Age of Onset  . Macular degeneration Mother   . Emphysema Mother   . Heart disease Brother    Social History   Socioeconomic History  . Marital status: Married    Spouse name: Windell Moulding  . Number of children: 2  . Years of education: 44  . Highest education level: Some college, no degree  Occupational History    Comment: Art gallery manager at [employer]  Tobacco Use  . Smoking status: Former Smoker    Types: Cigarettes    Quit date: 1978    Years since quitting: 43.3  . Smokeless tobacco: Former Neurosurgeon    Types: Chew    Quit date: 61  . Tobacco comment: un aware of how many or how long he smoked  Substance and Sexual Activity  . Alcohol use: Yes    Alcohol/week: 1.0 standard drinks    Types: 1 Cans of beer  per week    Comment: 5 a month  . Drug use: Never  . Sexual activity: Not Currently  Other Topics Concern  . Not on file  Social History Narrative  . Not on file   Social Determinants of Health   Financial Resource Strain: Low Risk   . Difficulty of Paying Living Expenses: Not hard at all  Food Insecurity: No Food Insecurity  . Worried About Programme researcher, broadcasting/film/video in the Last Year: Never true  . Ran Out of Food in the Last Year: Never true  Transportation Needs: No Transportation Needs  . Lack of Transportation (Medical): No  . Lack of Transportation (Non-Medical): No  Physical Activity: Sufficiently Active  . Days  of Exercise per Week: 5 days  . Minutes of Exercise per Session: 60 min  Stress: No Stress Concern Present  . Feeling of Stress : Not at all  Social Connections: Slightly Isolated  . Frequency of Communication with Friends and Family: More than three times a week  . Frequency of Social Gatherings with Friends and Family: Once a week  . Attends Religious Services: More than 4 times per year  . Active Member of Clubs or Organizations: No  . Attends Banker Meetings: Never  . Marital Status: Married    Outpatient Encounter Medications as of 10/02/2019  Medication Sig  . acetaminophen (TYLENOL) 650 MG CR tablet Take 1,300 mg by mouth every 12 (twelve) hours as needed for pain.  Marland Kitchen aspirin EC 81 MG tablet Take 1 tablet (81 mg total) by mouth daily.  Marland Kitchen atorvastatin (LIPITOR) 20 MG tablet TAKE 1 TABLET BY MOUTH EVERYDAY AT BEDTIME  . Multiple Vitamin (MULTIVITAMIN) tablet Take 1 tablet by mouth daily.  . Multiple Vitamins-Minerals (PRESERVISION AREDS) CAPS One by mouth twice a day   No facility-administered encounter medications on file as of 10/02/2019.    Activities of Daily Living In your present state of health, do you have any difficulty performing the following activities: 10/02/2019 09/12/2019  Hearing? Y N  Comment declines hearing aids -  Vision? N N  Comment - -  Difficulty concentrating or making decisions? N N  Walking or climbing stairs? N N  Dressing or bathing? N N  Doing errands, shopping? N N  Preparing Food and eating ? N -  Using the Toilet? N -  In the past six months, have you accidently leaked urine? N -  Do you have problems with loss of bowel control? N -  Managing your Medications? N -  Managing your Finances? N -  Housekeeping or managing your Housekeeping? N -  Some recent data might be hidden    Patient Care Team: Danelle Berry, PA-C as PCP - General (Family Medicine)   Assessment:   This is a routine wellness examination for  Zachary Walker.  Exercise Activities and Dietary recommendations Current Exercise Habits: Home exercise routine, Type of exercise: walking;Other - see comments(yard work, working at Systems analyst course part time), Time (Minutes): 60, Frequency (Times/Week): 5, Weekly Exercise (Minutes/Week): 300, Intensity: Moderate, Exercise limited by: None identified  Goals    . DIET - INCREASE WATER INTAKE     Recommend drinking 6-8 glasses of water per day       Fall Risk Fall Risk  10/02/2019 09/12/2019 08/25/2019 08/25/2019 03/10/2019  Falls in the past year? 0 0 1 1 0  Number falls in past yr: 0 0 0 1 0  Injury with Fall? 0 0 1 1 0  Risk for  fall due to : History of fall(s) - History of fall(s) History of fall(s);Impaired balance/gait -  Follow up Falls prevention discussed - Falls evaluation completed;Education provided;Falls prevention discussed Falls evaluation completed;Education provided;Falls prevention discussed -  Comment - - - he went to PT, is improved, no subsequent falls -   FALL RISK PREVENTION PERTAINING TO THE HOME:  Any stairs in or around the home? Yes  If so, do they handrails? No  2 steps outside  Home free of loose throw rugs in walkways, pet beds, electrical cords, etc? Yes  Adequate lighting in your home to reduce risk of falls? Yes   ASSISTIVE DEVICES UTILIZED TO PREVENT FALLS:  Life alert? No  Use of a cane, walker or w/c? No  Grab bars in the bathroom? No  Shower chair or bench in shower? No  Elevated toilet seat or a handicapped toilet? No   DME ORDERS:  DME order needed?  No   TIMED UP AND GO:  Was the test performed? No . Telephonic visit.   Education: Fall risk prevention has been discussed.  Intervention(s) required? No   Depression Screen PHQ 2/9 Scores 10/02/2019 09/12/2019 08/25/2019 03/10/2019  PHQ - 2 Score 0 0 0 0  PHQ- 9 Score - 0 0 0    Cognitive Function     6CIT Screen 10/02/2019 09/26/2018  What Year? 0 points 0 points  What month? 0 points 0 points  What  time? 0 points 0 points  Count back from 20 0 points 0 points  Months in reverse 0 points 2 points  Repeat phrase 2 points 0 points  Total Score 2 2    Immunization History  Administered Date(s) Administered  . Influenza, High Dose Seasonal PF 03/20/2015, 03/04/2018, 02/24/2019  . Influenza-Unspecified 03/31/2011, 03/30/2012, 03/25/2016, 02/05/2018  . PFIZER SARS-COV-2 Vaccination 06/12/2019, 07/03/2019  . Pneumococcal Conjugate-13 08/04/2013  . Pneumococcal Polysaccharide-23 03/04/2018  . Td 08/18/2014    Qualifies for Shingles Vaccine? Yes . Due for Shingrix. Education has been provided regarding the importance of this vaccine. Pt has been advised to call insurance company to determine out of pocket expense. Advised may also receive vaccine at local pharmacy or Health Dept. Verbalized acceptance and understanding.  Tdap: Up to date  Flu Vaccine: Up to date  Pneumococcal Vaccine: Up to date  Covid-19 Vaccine: Up to date   Screening Tests Health Maintenance  Topic Date Due  . INFLUENZA VACCINE  01/04/2020  . TETANUS/TDAP  08/17/2024  . COVID-19 Vaccine  Completed  . PNA vac Low Risk Adult  Completed   Cancer Screenings:  Colorectal Screening: No longer required.   Lung Cancer Screening: (Low Dose CT Chest recommended if Age 41-80 years, 30 pack-year currently smoking OR have quit w/in 15years.) does not qualify.    Additional Screening:  Hepatitis C Screening: no longer required  Vision Screening: Recommended annual ophthalmology exams for early detection of glaucoma and other disorders of the eye. Is the patient up to date with their annual eye exam?  Yes  Who is the provider or what is the name of the office in which the pt attends annual eye exams? [facility]  Dental Screening: Recommended annual dental exams for proper oral hygiene  Community Resource Referral:  CRR required this visit?  No       Plan:    I have personally reviewed and addressed  the Medicare Annual Wellness questionnaire and have noted the following in the patient's chart:  A. Medical and social  history B. Use of alcohol, tobacco or illicit drugs  C. Current medications and supplements D. Functional ability and status E.  Nutritional status F.  Physical activity G. Advance directives H. List of other physicians I.  Hospitalizations, surgeries, and ER visits in previous 12 months J.  [place] such as hearing and vision if needed, cognitive and depression L. Referrals and appointments   In addition, I have reviewed and discussed with patient certain preventive protocols, quality metrics, and best practice recommendations. A written personalized care plan for preventive services as well as general preventive health recommendations were provided to patient.   Signed,  Clemetine Marker, LPN Nurse Health Advisor   Nurse Notes: none

## 2020-02-25 ENCOUNTER — Ambulatory Visit: Payer: Medicare PPO | Admitting: Family Medicine

## 2020-03-08 ENCOUNTER — Ambulatory Visit: Payer: Self-pay | Attending: Internal Medicine

## 2020-03-08 DIAGNOSIS — Z23 Encounter for immunization: Secondary | ICD-10-CM

## 2020-03-08 NOTE — Progress Notes (Signed)
° °  Covid-19 Vaccination Clinic  Name:  Zachary Walker    MRN: [id number] DOB: Nov 17, 1940  03/08/2020  Mr. Zachary Walker was observed post Covid-19 immunization for 15 minutes without incident. He was provided with Vaccine Information Sheet and instruction to access the V-Safe system.   Mr. Zachary Walker was instructed to call 911 with any severe reactions post vaccine:  Difficulty breathing   Swelling of face and throat   A fast heartbeat   A bad rash all over body   Dizziness and weakness

## 2020-03-19 ENCOUNTER — Ambulatory Visit: Payer: Medicare PPO | Admitting: Family Medicine

## 2020-03-19 ENCOUNTER — Other Ambulatory Visit: Payer: Self-pay

## 2020-03-19 ENCOUNTER — Encounter: Payer: Self-pay | Admitting: Family Medicine

## 2020-03-19 VITALS — BP 130/74 | HR 93 | Temp 98.2°F | Resp 14 | Ht 68.0 in | Wt 161.1 lb

## 2020-03-19 DIAGNOSIS — E782 Mixed hyperlipidemia: Secondary | ICD-10-CM

## 2020-03-19 DIAGNOSIS — Z5181 Encounter for therapeutic drug level monitoring: Secondary | ICD-10-CM

## 2020-03-19 DIAGNOSIS — I700 Atherosclerosis of aorta: Secondary | ICD-10-CM

## 2020-03-19 DIAGNOSIS — E785 Hyperlipidemia, unspecified: Secondary | ICD-10-CM

## 2020-03-19 DIAGNOSIS — N179 Acute kidney failure, unspecified: Secondary | ICD-10-CM

## 2020-03-19 NOTE — Patient Instructions (Signed)
Request your medication refill from your pharmacy when you are about out  WE will see you next April     Chronic Kidney Disease, Adult Chronic kidney disease (CKD) happens when the kidneys are damaged over a long period of time. The kidneys are two organs that help with:  Getting rid of waste and extra fluid from the blood.  Making hormones that maintain the amount of fluid in your tissues and blood vessels.  Making sure that the body has the right amount of fluids and chemicals. Most of the time, CKD does not go away, but it can usually be controlled. Steps must be taken to slow down the kidney damage or to stop it from getting worse. If this is not done, the kidneys may stop working. Follow these instructions at home: Medicines  Take over-the-counter and prescription medicines only as told by your doctor. You may need to change the amount of medicines you take.  Do not take any new medicines unless your doctor says it is okay. Many medicines can make your kidney damage worse.  Do not take any vitamin and supplements unless your doctor says it is okay. Many vitamins and supplements can make your kidney damage worse. General instructions  Follow a diet as told by your doctor. You may need to stay away from: ? Alcohol. ? Salty foods. ? Foods that are high in:  Potassium.  Calcium.  Protein.  Do not use any products that contain nicotine or tobacco, such as cigarettes and e-cigarettes. If you need help quitting, ask your doctor.  Keep track of your blood pressure at home. Tell your doctor about any changes.  If you have diabetes, keep track of your blood sugar as told by your doctor.  Try to stay at a healthy weight. If you need help, ask your doctor.  Exercise at least 30 minutes a day, 5 days a week.  Stay up-to-date with your shots (immunizations) as told by your doctor.  Keep all follow-up visits as told by your doctor. This is important. Contact a doctor  if:  Your symptoms get worse.  You have new symptoms. Get help right away if:  You have symptoms of end-stage kidney disease. These may include: ? Headaches. ? Numbness in your hands or feet. ? Easy bruising. ? Having hiccups often. ? Chest pain. ? Shortness of breath. ? Stopping of menstrual periods in women.  You have a fever.  You have very little pee (urine).  You have pain or bleeding when you pee. Summary  Chronic kidney disease (CKD) happens when the kidneys are damaged over a long period of time.  Most of the time, this condition does not go away, but it can usually be controlled. Steps must be taken to slow down the kidney damage or to stop it from getting worse.  Treatment may include a combination of medicines and lifestyle changes. This information is not intended to replace advice given to you by your health care provider. Make sure you discuss any questions you have with your health care provider. Document Revised: 05/04/2017 Document Reviewed: 06/26/2016 Elsevier Patient Education  2020 [employer].

## 2020-03-19 NOTE — Progress Notes (Signed)
Name: Zachary Walker   MRN: [id number]    DOB: [birth year]   Date:03/19/2020       Progress Note  Chief Complaint  Patient presents with  . Hyperlipidemia     Subjective:   Zachary Walker is a 79 y.o. male, presents to clinic for f/up  Hyperlipidemia: Currently treated with Lipitor 20 mg, pt reports good med compliance Last Lipids: Lab Results  Component Value Date   CHOL 126 09/04/2019   HDL 39 (L) 09/04/2019   LDLCALC 59 09/04/2019   TRIG 214 (H) 09/04/2019   CHOLHDL 3.2 09/04/2019   - Denies: Chest pain, shortness of breath, myalgias, claudication  Lab Results  Component Value Date   ALT 14 09/04/2019   AST 16 09/04/2019   BILITOT 0.7 09/04/2019    Last labs show decline in kidney function with the increase in serum creatinine and decrease in GFR, patient is not on any blood pressure medications. He uses Tylenol occasionally but generally avoids NSAIDs   Current Outpatient Medications:  .  acetaminophen (TYLENOL) 650 MG CR tablet, Take 1,300 mg by mouth every 12 (twelve) hours as needed for pain., Disp: , Rfl:  .  aspirin EC 81 MG tablet, Take 1 tablet (81 mg total) by mouth daily., Disp: , Rfl:  .  atorvastatin (LIPITOR) 20 MG tablet, TAKE 1 TABLET BY MOUTH EVERYDAY AT BEDTIME, Disp: 90 tablet, Rfl: 3 .  Multiple Vitamin (MULTIVITAMIN) tablet, Take 1 tablet by mouth daily., Disp: , Rfl:  .  Multiple Vitamins-Minerals (PRESERVISION AREDS) CAPS, One by mouth twice a day, Disp: , Rfl: 0  Patient Active Problem List   Diagnosis Date Noted  . At high risk for falls 08/25/2019  . Multiple falls 08/25/2019  . Abdominal aortic atherosclerosis (HCC) 03/11/2019  . 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 Hyperlipidemia LDL goal <70 03/06/2018  . Elevated PSA 03/06/2018  . 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 Arthropathy, unspecified, other specified sites 03/04/2018  . Nonexudative age-related macular  degeneration, bilateral, early dry stage 11/23/2016  . 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 Left carotid stenosis 08/04/2013    Past Surgical History:  Procedure Laterality Date  . CAROTID STENT Left 2014    Family History  Problem Relation Age of Onset  . Macular degeneration Mother   . Emphysema Mother   . Heart disease Brother     Social History   Tobacco Use  . Smoking status: Former Smoker    Types: Cigarettes    Quit date: 1978    Years since quitting: 43.8  . Smokeless tobacco: Former Neurosurgeon    Types: Chew    Quit date: 14  . Tobacco comment: un aware of how many or how long he smoked  Vaping Use  . Vaping Use: Never used  Substance Use Topics  . Alcohol use: Yes    Alcohol/week: 1.0 standard drink    Types: 1 Cans of beer per week    Comment: 5 a month  . Drug use: Never     No Known Allergies  Health Maintenance  Topic Date Due  . Hepatitis C Screening  Never done  . TETANUS/TDAP  08/17/2024  . INFLUENZA VACCINE  Completed  . COVID-19 Vaccine  Completed  . PNA vac Low Risk Adult  Completed    Chart Review Today: I personally reviewed active problem list, medication list, allergies, family history, social history, health maintenance, notes from last encounter, lab results, imaging  with the patient/caregiver today.   Review of Systems  10 Systems reviewed and are negative for acute change except as noted in the HPI.  Objective:   Vitals:   03/19/20 1433  BP: 130/74  Pulse: 93  Resp: 14  Temp: 98.2 F (36.8 C)  TempSrc: Oral  SpO2: 99%  Weight: 161 lb 1.6 oz (73.1 kg)  Height: 5\' 8"  (1.727 m)    Body mass index is 24.5 kg/m.  Physical Exam Vitals and nursing note reviewed.  Constitutional:      General: He is not in acute distress.    Appearance: Normal appearance. He is normal weight. He is not ill-appearing, toxic-appearing or diaphoretic.     Comments: Elderly male, appears stated age, pleasant  HENT:     Head: Normocephalic  and atraumatic.     Right Ear: External ear normal.     Left Ear: External ear normal.  Eyes:     General: No scleral icterus.       Right eye: No discharge.        Left eye: No discharge.  Cardiovascular:     Rate and Rhythm: Normal rate and regular rhythm.     Pulses: Normal pulses.     Heart sounds: Normal heart sounds.  Pulmonary:     Effort: Pulmonary effort is normal. No respiratory distress.     Breath sounds: Normal breath sounds. No stridor. No wheezing, rhonchi or rales.  Abdominal:     General: Bowel sounds are normal.  Musculoskeletal:        General: No tenderness.     Cervical back: Normal range of motion.     Right lower leg: No edema.     Left lower leg: No edema.  Skin:    General: Skin is warm and dry.     Coloration: Skin is not jaundiced or pale.  Neurological:     Mental Status: Mental status is at baseline.     Gait: Gait normal.  Psychiatric:        Mood and Affect: Mood normal.        Behavior: Behavior normal.         Assessment & Plan:     ICD-10-CM   1. 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 Abdominal aortic atherosclerosis (HCC)  I70.0 Lipid panel   On statin, tolerating, monitoring  3. AKI (acute kidney injury) (HCC)  N17.9 COMPLETE METABOLIC PANEL WITH GFR   Recheck labs encouraged monitoring blood pressure, avoiding nephrotoxic meds/supplements, encouraged him to be well-hydrated  4. Encounter for medication monitoring  Z51.81 COMPLETE METABOLIC PANEL WITH GFR    CBC with Differential/Platelet    Lipid panel     Return in about 6 months (around 09/17/2020) for Routine follow-up cholesterol kidney's.   09/19/2020, PA-C 03/19/20 2:37 PM

## 2020-03-20 LAB — CBC WITH DIFFERENTIAL/PLATELET
Absolute Monocytes: 842 cells/uL (ref 200–950)
Basophils Absolute: 49 cells/uL (ref 0–200)
Basophils Relative: 0.6 %
Eosinophils Absolute: 122 cells/uL (ref 15–500)
Eosinophils Relative: 1.5 %
HCT: 40.7 % (ref 38.5–50.0)
Hemoglobin: 13.5 g/dL (ref 13.2–17.1)
Lymphs Abs: 2203 cells/uL (ref 850–3900)
MCH: 31.8 pg (ref 27.0–33.0)
MCHC: 33.2 g/dL (ref 32.0–36.0)
MCV: 96 fL (ref 80.0–100.0)
MPV: 9.7 fL (ref 7.5–12.5)
Monocytes Relative: 10.4 %
Neutro Abs: 4884 cells/uL (ref 1500–7800)
Neutrophils Relative %: 60.3 %
Platelets: 232 10*3/uL (ref 140–400)
RBC: 4.24 10*6/uL (ref 4.20–5.80)
RDW: 11.7 % (ref 11.0–15.0)
Total Lymphocyte: 27.2 %
WBC: 8.1 10*3/uL (ref 3.8–10.8)

## 2020-03-20 LAB — COMPLETE METABOLIC PANEL WITH GFR
AG Ratio: 1.6 (calc) (ref 1.0–2.5)
ALT: 8 U/L — ABNORMAL LOW (ref 9–46)
AST: 15 U/L (ref 10–35)
Albumin: 4.4 g/dL (ref 3.6–5.1)
Alkaline phosphatase (APISO): 86 U/L (ref 35–144)
BUN: 19 mg/dL (ref 7–25)
CO2: 28 mmol/L (ref 20–32)
Calcium: 9.3 mg/dL (ref 8.6–10.3)
Chloride: 104 mmol/L (ref 98–110)
Creat: 1.13 mg/dL (ref 0.70–1.18)
GFR, Est African American: 71 mL/min/{1.73_m2} (ref >=60)
GFR, Est Non African American: 61 mL/min/{1.73_m2} (ref >=60)
Globulin: 2.7 g/dL (calc) (ref 1.9–3.7)
Glucose, Bld: 106 mg/dL — ABNORMAL HIGH (ref 65–99)
Potassium: 4.1 mmol/L (ref 3.5–5.3)
Sodium: 139 mmol/L (ref 135–146)
Total Bilirubin: 0.6 mg/dL (ref 0.2–1.2)
Total Protein: 7.1 g/dL (ref 6.1–8.1)

## 2020-03-20 LAB — LIPID PANEL
Cholesterol: 126 mg/dL (ref <200)
HDL: 40 mg/dL (ref >=40)
LDL Cholesterol (Calc): 56 mg/dL (calc)
Non-HDL Cholesterol (Calc): 86 mg/dL (calc) (ref <130)
Total CHOL/HDL Ratio: 3.2 (calc) (ref <5.0)
Triglycerides: 242 mg/dL — ABNORMAL HIGH (ref <150)

## 2020-03-22 ENCOUNTER — Encounter: Payer: Self-pay | Admitting: Family Medicine

## 2020-03-24 ENCOUNTER — Encounter: Payer: Self-pay | Admitting: Family Medicine

## 2020-08-18 ENCOUNTER — Other Ambulatory Visit: Payer: Self-pay | Admitting: Family Medicine

## 2020-10-05 ENCOUNTER — Ambulatory Visit (INDEPENDENT_AMBULATORY_CARE_PROVIDER_SITE_OTHER): Payer: Medicare PPO

## 2020-10-05 DIAGNOSIS — Z0000 Encounter for general adult medical examination without abnormal findings: Secondary | ICD-10-CM | POA: Diagnosis not present

## 2020-10-05 NOTE — Patient Instructions (Signed)
Zachary Walker , Thank you for taking time to come for your Medicare Wellness Visit. I appreciate your ongoing commitment to your health goals. Please review the following plan we discussed and let me know if I can assist you in the future.   Screening recommendations/referrals: Colonoscopy: no longer required Recommended yearly ophthalmology/optometry visit for glaucoma screening and checkup Recommended yearly dental visit for hygiene and checkup  Vaccinations: Influenza vaccine: done 03/12/20  Pneumococcal vaccine: done 03/04/18 Tdap vaccine: done 08/18/14 Shingles vaccine: Shingrix discussed. Please contact your pharmacy for coverage information.  Covid-19: done 06/12/19, 07/03/19, 03/08/20 & 09/24/20  Advanced directives: Please bring a copy of your health care power of attorney and living will to the office at your convenience.  Conditions/risks identified: Recommend increasing physical activity to 150 minutes per week   Next appointment: Follow up in one year for your annual wellness visit.   Preventive Care 80 Years and Older, Male Preventive care refers to lifestyle choices and visits with your health care provider that can promote health and wellness. What does preventive care include?  A yearly physical exam. This is also called an annual well check.  Dental exams once or twice a year.  Routine eye exams. Ask your health care provider how often you should have your eyes checked.  Personal lifestyle choices, including:  Daily care of your teeth and gums.  Regular physical activity.  Eating a healthy diet.  Avoiding tobacco and drug use.  Limiting alcohol use.  Practicing safe sex.  Taking low doses of aspirin every day.  Taking vitamin and mineral supplements as recommended by your health care provider. What happens during an annual well check? The services and screenings done by your health care provider during your annual well check will depend on your age, overall  health, lifestyle risk factors, and family history of disease. Counseling  Your health care provider may ask you questions about your:  Alcohol use.  Tobacco use.  Drug use.  Emotional well-being.  Home and relationship well-being.  Sexual activity.  Eating habits.  History of falls.  Memory and ability to understand (cognition).  Work and work Astronomer. Screening  You may have the following tests or measurements:  Height, weight, and BMI.  Blood pressure.  Lipid and cholesterol levels. These may be checked every 5 years, or more frequently if you are over 65 years old.  Skin check.  Lung cancer screening. You may have this screening every year starting at age 23 if you have a 30-pack-year history of smoking and currently smoke or have quit within the past 15 years.  Fecal occult blood test (FOBT) of the stool. You may have this test every year starting at age 32.  Flexible sigmoidoscopy or colonoscopy. You may have a sigmoidoscopy every 5 years or a colonoscopy every 10 years starting at age 40.  Prostate cancer screening. Recommendations will vary depending on your family history and other risks.  Hepatitis C blood test.  Hepatitis B blood test.  Sexually transmitted disease (STD) testing.  Diabetes screening. This is done by checking your blood sugar (glucose) after you have not eaten for a while (fasting). You may have this done every 1-3 years.  Abdominal aortic aneurysm (AAA) screening. You may need this if you are a current or former smoker.  Osteoporosis. You may be screened starting at age 6 if you are at high risk. Talk with your health care provider about your test results, treatment options, and if necessary, the need for  more tests. Vaccines  Your health care provider may recommend certain vaccines, such as:  Influenza vaccine. This is recommended every year.  Tetanus, diphtheria, and acellular pertussis (Tdap, Td) vaccine. You may need a Td  booster every 10 years.  Zoster vaccine. You may need this after age 67.  Pneumococcal 13-valent conjugate (PCV13) vaccine. One dose is recommended after age 61.  Pneumococcal polysaccharide (PPSV23) vaccine. One dose is recommended after age 45. Talk to your health care provider about which screenings and vaccines you need and how often you need them. This information is not intended to replace advice given to you by your health care provider. Make sure you discuss any questions you have with your health care provider. Document Released: 06/18/2015 Document Revised: 02/09/2016 Document Reviewed: 03/23/2015 Elsevier Interactive Patient Education  2017 [place] Prevention in the Home Falls can cause injuries. They can happen to people of all ages. There are many things you can do to make your home safe and to help prevent falls. What can I do on the outside of my home?  Regularly fix the edges of walkways and driveways and fix any cracks.  Remove anything that might make you trip as you walk through a door, such as a raised step or threshold.  Trim any bushes or trees on the path to your home.  Use bright outdoor lighting.  Clear any walking paths of anything that might make someone trip, such as rocks or tools.  Regularly check to see if handrails are loose or broken. Make sure that both sides of any steps have handrails.  Any raised decks and porches should have guardrails on the edges.  Have any leaves, snow, or ice cleared regularly.  Use sand or salt on walking paths during winter.  Clean up any spills in your garage right away. This includes oil or grease spills. What can I do in the bathroom?  Use night lights.  Install grab bars by the toilet and in the tub and shower. Do not use towel bars as grab bars.  Use non-skid mats or decals in the tub or shower.  If you need to sit down in the shower, use a plastic, non-slip stool.  Keep the floor dry. Clean up  any water that spills on the floor as soon as it happens.  Remove soap buildup in the tub or shower regularly.  Attach bath mats securely with double-sided non-slip rug tape.  Do not have throw rugs and other things on the floor that can make you trip. What can I do in the bedroom?  Use night lights.  Make sure that you have a light by your bed that is easy to reach.  Do not use any sheets or blankets that are too big for your bed. They should not hang down onto the floor.  Have a firm chair that has side arms. You can use this for support while you get dressed.  Do not have throw rugs and other things on the floor that can make you trip. What can I do in the kitchen?  Clean up any spills right away.  Avoid walking on wet floors.  Keep items that you use a lot in easy-to-reach places.  If you need to reach something above you, use a strong step stool that has a grab bar.  Keep electrical cords out of the way.  Do not use floor polish or wax that makes floors slippery. If you must use wax, use non-skid  floor wax.  Do not have throw rugs and other things on the floor that can make you trip. What can I do with my stairs?  Do not leave any items on the stairs.  Make sure that there are handrails on both sides of the stairs and use them. Fix handrails that are broken or loose. Make sure that handrails are as long as the stairways.  Check any carpeting to make sure that it is firmly attached to the stairs. Fix any carpet that is loose or worn.  Avoid having throw rugs at the top or bottom of the stairs. If you do have throw rugs, attach them to the floor with carpet tape.  Make sure that you have a light switch at the top of the stairs and the bottom of the stairs. If you do not have them, ask someone to add them for you. What else can I do to help prevent falls?  Wear shoes that:  Do not have high heels.  Have rubber bottoms.  Are comfortable and fit you well.  Are  closed at the toe. Do not wear sandals.  If you use a stepladder:  Make sure that it is fully opened. Do not climb a closed stepladder.  Make sure that both sides of the stepladder are locked into place.  Ask someone to hold it for you, if possible.  Clearly mark and make sure that you can see:  Any grab bars or handrails.  First and last steps.  Where the edge of each step is.  Use tools that help you move around (mobility aids) if they are needed. These include:  Canes.  Walkers.  Scooters.  Crutches.  Turn on the lights when you go into a dark area. Replace any light bulbs as soon as they burn out.  Set up your furniture so you have a clear path. Avoid moving your furniture around.  If any of your floors are uneven, fix them.  If there are any pets around you, be aware of where they are.  Review your medicines with your doctor. Some medicines can make you feel dizzy. This can increase your chance of falling. Ask your doctor what other things that you can do to help prevent falls. This information is not intended to replace advice given to you by your health care provider. Make sure you discuss any questions you have with your health care provider. Document Released: 03/18/2009 Document Revised: 10/28/2015 Document Reviewed: 06/26/2014 Elsevier Interactive Patient Education  2017 [employer].

## 2020-10-05 NOTE — Progress Notes (Signed)
Subjective:   Zachary Walker is a 80 y.o. male who presents for Medicare Annual/Subsequent preventive examination.   Virtual Visit via Telephone Note  I connected with  Zachary Walker on 10/05/20 at  1:30 PM EDT by telephone and verified that I am speaking with the correct person using two identifiers.  Location: Patient: home Provider: [facility] Persons participating in the virtual visit: patient/Nurse Health Advisor   I discussed the limitations, risks, security and privacy concerns of performing an evaluation and management service by telephone and the availability of in person appointments. The patient expressed understanding and agreed to proceed.  Interactive audio and video telecommunications were attempted between this nurse and patient, however failed, due to patient having technical difficulties OR patient did not have access to video capability.  We continued and completed visit with audio only.  Some vital signs may be absent or patient reported.   Reather Littler, LPN    Review of Systems     Cardiac Risk Factors include: advanced age (>45men, >69 women);dyslipidemia;male gender     Objective:    There were no vitals filed for this visit. There is no height or weight on file to calculate BMI.  Advanced Directives 10/05/2020 10/02/2019 03/18/2019 09/26/2018  Does Patient Have a Medical Advance Directive? Yes Yes Yes Yes  Type of Estate agent of [place];Living will Healthcare Power of [place];Living will Living will Living will;Healthcare Power of Attorney  Does patient want to make changes to medical advance directive? - - No - Patient declined -  Copy of Healthcare Power of Attorney in Chart? No - copy requested Yes - validated most recent copy scanned in chart (See row information) - No - copy requested    Current Medications (verified) Outpatient Encounter Medications as of 10/05/2020  Medication Sig  . acetaminophen (TYLENOL) 650 MG CR tablet  Take 1,300 mg by mouth every 12 (twelve) hours as needed for pain.  Marland Kitchen aspirin EC 81 MG tablet Take 1 tablet (81 mg total) by mouth daily.  Marland Kitchen atorvastatin (LIPITOR) 20 MG tablet TAKE 1 TABLET BY MOUTH EVERYDAY AT BEDTIME  . Multiple Vitamin (MULTIVITAMIN) tablet Take 1 tablet by mouth daily.  . Multiple Vitamins-Minerals (PRESERVISION AREDS) CAPS One by mouth twice a day   No facility-administered encounter medications on file as of 10/05/2020.    Allergies (verified) Patient has no known allergies.   History: Past Medical History:  Diagnosis Date  . GERD (gastroesophageal reflux disease)   . Hyperlipidemia   . Macular degeneration   . Sciatica 02/27/2011   Past Surgical History:  Procedure Laterality Date  . CAROTID STENT Left 2014   Family History  Problem Relation Age of Onset  . Macular degeneration Mother   . Emphysema Mother   . Heart disease Brother    Social History   Socioeconomic History  . Marital status: Married    Spouse name: Windell Moulding  . Number of children: 2  . Years of education: 3  . Highest education level: Some college, no degree  Occupational History    Comment: Art gallery manager at [employer]  Tobacco Use  . Smoking status: Former Smoker    Types: Cigarettes    Quit date: 1978    Years since quitting: 44.3  . Smokeless tobacco: Former Neurosurgeon    Types: Chew    Quit date: 1978  Advertising account planner  . Vaping Use: Never used  Substance and Sexual Activity  . Alcohol use: Yes    Alcohol/week: 1.0 standard  drink    Types: 1 Cans of beer per week    Comment: 5 a month  . Drug use: Never  . Sexual activity: Not Currently  Other Topics Concern  . Not on file  Social History Narrative  . Not on file   Social Determinants of Health   Financial Resource Strain: Low Risk   . Difficulty of Paying Living Expenses: Not hard at all  Food Insecurity: No Food Insecurity  . Worried About Programme researcher, broadcasting/film/video in the Last Year: Never true  . Ran Out of Food in the Last Year: Never  true  Transportation Needs: No Transportation Needs  . Lack of Transportation (Medical): No  . Lack of Transportation (Non-Medical): No  Physical Activity: Insufficiently Active  . Days of Exercise per Week: 3 days  . Minutes of Exercise per Session: 30 min  Stress: No Stress Concern Present  . Feeling of Stress : Not at all  Social Connections: Moderately Integrated  . Frequency of Communication with Friends and Family: More than three times a week  . Frequency of Social Gatherings with Friends and Family: Once a week  . Attends Religious Services: More than 4 times per year  . Active Member of Clubs or Organizations: No  . Attends Banker Meetings: Never  . Marital Status: Married    Tobacco Counseling Counseling given: Not Answered   Clinical Intake:  Pre-visit preparation completed: Yes  Pain : No/denies pain     Nutritional Risks: None Diabetes: No  How often do you need to have someone help you when you read instructions, pamphlets, or other written materials from your doctor or pharmacy?: 1 - Never    Interpreter Needed?: No  Information entered by :: Reather Littler LPN   Activities of Daily Living In your present state of health, do you have any difficulty performing the following activities: 10/05/2020 03/19/2020  Hearing? Y N  Comment wears hearing aids -  Vision? N Y  Difficulty concentrating or making decisions? N N  Walking or climbing stairs? N N  Dressing or bathing? N N  Doing errands, shopping? N N  Preparing Food and eating ? N -  Using the Toilet? Y -  In the past six months, have you accidently leaked urine? N -  Do you have problems with loss of bowel control? N -  Managing your Medications? N -  Managing your Finances? N -  Housekeeping or managing your Housekeeping? N -  Some recent data might be hidden    Patient Care Team: Danelle Berry, PA-C as PCP - General (Family Medicine)  Indicate any recent Medical Services you may  have received from other than [facility] providers in the past year (date may be approximate).     Assessment:   This is a routine wellness examination for Zachary Walker.  Hearing/Vision screen  Hearing Screening   125Hz  250Hz  500Hz  1000Hz  2000Hz  3000Hz  4000Hz  6000Hz  8000Hz   Right ear:           Left ear:           Comments: Pt wears hearing aids   Vision Screening Comments: Annual vision screenings done at [facility]; trial for macular degeneration ended; pt plans to transfer back to Dr. at [facility]  Dietary issues and exercise activities discussed: Current Exercise Habits: Home exercise routine, Type of exercise: walking;Other - see comments (yard work), Time (Minutes): 30, Frequency (Times/Week): 3, Weekly Exercise (Minutes/Week): 90, Intensity: Moderate, Exercise limited  by: None identified  Goals Addressed            This Visit's Progress   . DIET - INCREASE WATER INTAKE   On track    Recommend drinking 6-8 glasses of water per day    . Increase physical activity       Recommend increasing physical activity to 150 minutes per week       Depression Screen PHQ 2/9 Scores 10/05/2020 03/19/2020 10/02/2019 09/12/2019 08/25/2019 03/10/2019 01/29/2019  PHQ - 2 Score 0 0 0 0 0 0 0  PHQ- 9 Score - - - 0 0 0 -    Fall Risk Fall Risk  10/05/2020 03/19/2020 10/02/2019 09/12/2019 08/25/2019  Falls in the past year? 0 0 0 0 1  Number falls in past yr: 0 0 0 0 0  Injury with Fall? 0 0 0 0 1  Risk for fall due to : No Fall Risks - History of fall(s) - History of fall(s)  Follow up Falls prevention discussed Falls evaluation completed Falls prevention discussed - Falls evaluation completed;Education provided;Falls prevention discussed  Comment - - - - -    FALL RISK PREVENTION PERTAINING TO THE HOME:  Any stairs in or around the home? No  If so, are there any without handrails? No  Home free of loose throw rugs in walkways, pet beds, electrical cords, etc? Yes  Adequate  lighting in your home to reduce risk of falls? Yes   ASSISTIVE DEVICES UTILIZED TO PREVENT FALLS:  Life alert? No  Use of a cane, walker or w/c? No  Grab bars in the bathroom? No  Shower chair or bench in shower? No  Elevated toilet seat or a handicapped toilet? No   TIMED UP AND GO:  Was the test performed? No . Telephonic visit.   Cognitive Function: Normal cognitive status assessed by direct observation by this Nurse Health Advisor. No abnormalities found.       6CIT Screen 10/02/2019 09/26/2018  What Year? 0 points 0 points  What month? 0 points 0 points  What time? 0 points 0 points  Count back from 20 0 points 0 points  Months in reverse 0 points 2 points  Repeat phrase 2 points 0 points  Total Score 2 2    Immunizations Immunization History  Administered Date(s) Administered  . Influenza, High Dose Seasonal PF 03/20/2015, 03/04/2018, 02/24/2019, 03/12/2020  . Influenza-Unspecified 03/31/2011, 03/30/2012, 03/25/2016, 02/05/2018  . PFIZER(Purple Top)SARS-COV-2 Vaccination 06/12/2019, 07/03/2019, 03/08/2020, 09/24/2020  . Pneumococcal Conjugate-13 08/04/2013  . Pneumococcal Polysaccharide-23 03/04/2018  . Td 08/18/2014    TDAP status: Up to date  Flu Vaccine status: Up to date  Pneumococcal vaccine status: Up to date  Covid-19 vaccine status: Completed vaccines  Qualifies for Shingles Vaccine? Yes   Zostavax completed No   Shingrix Completed?: No.    Education has been provided regarding the importance of this vaccine. Patient has been advised to call insurance company to determine out of pocket expense if they have not yet received this vaccine. Advised may also receive vaccine at local pharmacy or Health Dept. Verbalized acceptance and understanding.  Screening Tests Health Maintenance  Topic Date Due  . Hepatitis C Screening  03/24/2021 (Originally 05/19/1941)  . INFLUENZA VACCINE  01/03/2021  . TETANUS/TDAP  08/17/2024  . COVID-19 Vaccine  Completed  .  PNA vac Low Risk Adult  Completed  . HPV VACCINES  Aged Out    Health Maintenance  There are no preventive care reminders to display  for this patient.  Colorectal cancer screening: No longer required.   Lung Cancer Screening: (Low Dose CT Chest recommended if Age 13-80 years, 30 pack-year currently smoking OR have quit w/in 15years.) does not qualify.   Additional Screening:  Hepatitis C Screening: does qualify; postponed  Vision Screening: Recommended annual ophthalmology exams for early detection of glaucoma and other disorders of the eye. Is the patient up to date with their annual eye exam?  Yes  Who is the provider or what is the name of the [facility] which the patient [facility]ends [facility]   Dental Screening: Recommended annual dental exams for proper oral hygiene  Community Resource Referral / Chronic Care Management: CRR required this visit?  No   CCM required this visit?  No      Plan:     I have personally reviewed and noted the following in the patient's chart:   . Medical and social history . Use of alcohol, tobacco or illicit drugs  . Current medications and supplements including opioid prescriptions. Patient is not currently taking opioid prescriptions. . Functional ability and status . Nutritional status . Physical activity . Advanced directives . List of other physicians . Hospitalizations, surgeries, and ER visits in previous 12 months . Vitals . Screenings to include cognitive, depression, and falls . Referrals and appointments  In addition, I have reviewed and discussed with patient certain preventive protocols, quality metrics, and best practice recommendations. A written personalized care plan for preventive services as well as general preventive health recommendations were provided to patient.     Reather LittlerKasey Alysse Rathe, LPN   4/0/10275/08/2020   Nurse Notes: none

## 2020-10-26 ENCOUNTER — Other Ambulatory Visit: Payer: Self-pay

## 2020-10-26 MED ORDER — ATORVASTATIN CALCIUM 20 MG PO TABS: ORAL_TABLET | ORAL | 0 refills | Status: DC

## 2020-11-02 ENCOUNTER — Telehealth: Payer: Self-pay | Admitting: Family Medicine

## 2020-11-02 ENCOUNTER — Other Ambulatory Visit: Payer: Self-pay

## 2020-11-02 MED ORDER — ATORVASTATIN CALCIUM 20 MG PO TABS: ORAL_TABLET | ORAL | 0 refills | Status: DC

## 2020-11-02 NOTE — Telephone Encounter (Signed)
Pt does not have refills on atorvastatin  20 mg at local [facility] and joanette pharm tech with [facility] can not call for refills. Pt will need new rx atorvastatin 20 mg #90 w/refills

## 2020-11-03 NOTE — Telephone Encounter (Signed)
#  30 was sent on 5/31

## 2020-11-16 ENCOUNTER — Ambulatory Visit: Payer: Medicare PPO | Admitting: Family Medicine

## 2020-12-07 ENCOUNTER — Ambulatory Visit: Payer: Medicare PPO | Admitting: Family Medicine

## 2020-12-14 ENCOUNTER — Ambulatory Visit: Payer: Medicare PPO | Admitting: Family Medicine

## 2020-12-14 ENCOUNTER — Other Ambulatory Visit: Payer: Self-pay

## 2020-12-14 ENCOUNTER — Encounter: Payer: Self-pay | Admitting: Family Medicine

## 2020-12-14 VITALS — BP 110/72 | HR 96 | Temp 98.2°F | Resp 16 | Ht 68.0 in | Wt 159.8 lb

## 2020-12-14 DIAGNOSIS — I700 Atherosclerosis of aorta: Secondary | ICD-10-CM | POA: Diagnosis not present

## 2020-12-14 DIAGNOSIS — N400 Enlarged prostate without lower urinary tract symptoms: Secondary | ICD-10-CM | POA: Diagnosis not present

## 2020-12-14 DIAGNOSIS — N1831 Chronic kidney disease, stage 3a: Secondary | ICD-10-CM | POA: Diagnosis not present

## 2020-12-14 LAB — BASIC METABOLIC PANEL
BUN: 14 mg/dL (ref 7–25)
CO2: 29 mmol/L (ref 20–32)
Calcium: 9.3 mg/dL (ref 8.6–10.3)
Chloride: 105 mmol/L (ref 98–110)
Creat: 1.13 mg/dL (ref 0.70–1.22)
Glucose, Bld: 106 mg/dL — ABNORMAL HIGH (ref 65–99)
Potassium: 4.4 mmol/L (ref 3.5–5.3)
Sodium: 140 mmol/L (ref 135–146)

## 2020-12-14 NOTE — Progress Notes (Signed)
   SUBJECTIVE:   CHIEF COMPLAINT / HPI:   aortic atherosclerosis, carotid stenosis: - Medications: ASA 81mg , lipitor - Compliance: Takes aspirin about a few times per week, compliant with Lipitor - Denies any SOB, CP, LE edema, medication SEs, or symptoms of hypotension - Exercise: works at golf course,  HLD - medications: lipitor 20mg  - compliance: Good - medication SEs: None  Benign Prostatic Hypertrophy - Medications: none - Symptoms:  no complicating symptoms - Denies: incomplete emptying, intermittency, straining, and weak stream gross hematuria - no personal history and no family history of prostate cancer - previously elevated PSA and evaluated by [facility] and left up to patient if he wanted to continue monitoring due to age.  Patient declines further monitoring as long as he is asymptomatic.  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  Previously seen by urology.  Declines further PSA testing as long as he is asymptomatic.  Abdominal aortic atherosclerosis (HCC) Currently asymptomatic and doing well.  Compliant with statin.  Discussed risk versus benefit of continued aspirin therapy given age.     Firefighter, DO

## 2020-12-14 NOTE — Assessment & Plan Note (Signed)
Currently asymptomatic and doing well.  Compliant with statin.  Discussed risk versus benefit of continued aspirin therapy given age.

## 2020-12-14 NOTE — Assessment & Plan Note (Signed)
Doing well, currently asymptomatic.  Previously seen by urology.  Declines further PSA testing as long as he is asymptomatic.

## 2020-12-14 NOTE — Patient Instructions (Signed)
It was great to see you!  Our plans for today:  - We are checking some labs today, we will release these results to your MyChart. - Come back in 6 months for follow up.   Take care and seek immediate care sooner if you develop any concerns.   Dr. Linwood Dibbles

## 2021-05-04 ENCOUNTER — Telehealth (INDEPENDENT_AMBULATORY_CARE_PROVIDER_SITE_OTHER): Payer: Medicare PPO | Admitting: Family Medicine

## 2021-05-04 ENCOUNTER — Encounter: Payer: Self-pay | Admitting: Family Medicine

## 2021-05-04 VITALS — Wt 156.0 lb

## 2021-05-04 DIAGNOSIS — J069 Acute upper respiratory infection, unspecified: Secondary | ICD-10-CM | POA: Diagnosis not present

## 2021-05-04 NOTE — Patient Instructions (Signed)
It was great to see you!  Our plans for today:  - See below for self-isolation guidelines. You may end your quarantine if your test is negative, or if positive, once you are 10 days from symptom onset and fever free for 24 hours without use of tylenol or ibuprofen. Wear a well-fitting N95 mask if you have to go out and about. - If your test is positive, we will be prescribing an antiviral medication to help prevent severe complications from COVID. - Certainly, if you are having difficulties breathing or unable to keep down fluids, go to the Emergency Department.   Take care and seek immediate care sooner if you develop any concerns.   Dr. Linwood Dibbles     Person Under Monitoring Name: Zachary Walker  Location: [address] [place] [address]   Infection Prevention Recommendations for Individuals Confirmed to have, or Being Evaluated for, 2019 Novel Coronavirus (COVID-19) Infection Who Receive Care at Home  Individuals who are confirmed to have, or are being evaluated for, COVID-19 should follow the prevention steps below until a healthcare provider or local or state health department says they can return to normal activities.  Stay home except to get medical care You should restrict activities outside your home, except for getting medical care. Do not go to work, school, or public areas, and do not use public transportation or taxis.  Call ahead before visiting your doctor Before your medical appointment, call the healthcare provider and tell them that you have, or are being evaluated for, COVID-19 infection. This will help the healthcare provider's office take steps to keep other people from getting infected. Ask your healthcare provider to call the local or state health department.  Monitor your symptoms Seek prompt medical attention if your illness is worsening (e.g., difficulty breathing). Before going to your medical appointment, call the healthcare provider and tell them  that you have, or are being evaluated for, COVID-19 infection. Ask your healthcare provider to call the local or state health department.  Wear a facemask You should wear a facemask that covers your nose and mouth when you are in the same room with other people and when you visit a healthcare provider. People who live with or visit you should also wear a facemask while they are in the same room with you.  Separate yourself from other people in your home As much as possible, you should stay in a different room from other people in your home. Also, you should use a separate bathroom, if available.  Avoid sharing household items You should not share dishes, drinking glasses, cups, eating utensils, towels, bedding, or other items with other people in your home. After using these items, you should wash them thoroughly with soap and water.  Cover your coughs and sneezes Cover your mouth and nose with a tissue when you cough or sneeze, or you can cough or sneeze into your sleeve. Throw used tissues in a lined trash can, and immediately wash your hands with soap and water for at least 20 seconds or use an alcohol-based hand rub.  Wash your [employer] your hands often and thoroughly with soap and water for at least 20 seconds. You can use an alcohol-based hand sanitizer if soap and water are not available and if your hands are not visibly dirty. Avoid touching your eyes, nose, and mouth with unwashed hands.   Prevention Steps for Caregivers and Household Members of Individuals Confirmed to have, or Being Evaluated for, COVID-19 Infection Being  Cared for in the Home  If you live with, or provide care at home for, a person confirmed to have, or being evaluated for, COVID-19 infection please follow these guidelines to prevent infection:  Follow healthcare provider's instructions Make sure that you understand and can help the patient follow any healthcare provider instructions for all  care.  Provide for the patient's basic needs You should help the patient with basic needs in the home and provide support for getting groceries, prescriptions, and other personal needs.  Monitor the patient's symptoms If they are getting sicker, call his or her medical provider and tell them that the patient has, or is being evaluated for, COVID-19 infection. This will help the healthcare provider's office take steps to keep other people from getting infected. Ask the healthcare provider to call the local or state health department.  Limit the number of people who have contact with the patient If possible, have only one caregiver for the patient. Other household members should stay in another home or place of residence. If this is not possible, they should stay in another room, or be separated from the patient as much as possible. Use a separate bathroom, if available. Restrict visitors who do not have an essential need to be in the home.  Keep older adults, very young children, and other sick people away from the patient Keep older adults, very young children, and those who have compromised immune systems or chronic health conditions away from the patient. This includes people with chronic heart, lung, or kidney conditions, diabetes, and cancer.  Ensure good ventilation Make sure that shared spaces in the home have good air flow, such as from an air conditioner or an opened window, weather permitting.  Wash your hands often Wash your hands often and thoroughly with soap and water for at least 20 seconds. You can use an alcohol based hand sanitizer if soap and water are not available and if your hands are not visibly dirty. Avoid touching your eyes, nose, and mouth with unwashed hands. Use disposable paper towels to dry your hands. If not available, use dedicated cloth towels and replace them when they become wet.  Wear a facemask and gloves Wear a disposable facemask at all times in  the room and gloves when you touch or have contact with the patient's blood, body fluids, and/or secretions or excretions, such as sweat, saliva, sputum, nasal mucus, vomit, urine, or feces.  Ensure the mask fits over your nose and mouth tightly, and do not touch it during use. Throw out disposable facemasks and gloves after using them. Do not reuse. Wash your hands immediately after removing your facemask and gloves. If your personal clothing becomes contaminated, carefully remove clothing and launder. Wash your hands after handling contaminated clothing. Place all used disposable facemasks, gloves, and other waste in a lined container before disposing them with other household waste. Remove gloves and wash your hands immediately after handling these items.  Do not share dishes, glasses, or other household items with the patient Avoid sharing household items. You should not share dishes, drinking glasses, cups, eating utensils, towels, bedding, or other items with a patient who is confirmed to have, or being evaluated for, COVID-19 infection. After the person uses these items, you should wash them thoroughly with soap and water.  Wash laundry thoroughly Immediately remove and wash clothes or bedding that have blood, body fluids, and/or secretions or excretions, such as sweat, saliva, sputum, nasal mucus, vomit, urine, or feces, on them.  Wear gloves when handling laundry from the patient. Read and follow directions on labels of laundry or clothing items and detergent. In general, wash and dry with the warmest temperatures recommended on the label.  Clean all areas the individual has used often Clean all touchable surfaces, such as counters, tabletops, doorknobs, bathroom fixtures, toilets, phones, keyboards, tablets, and bedside tables, every day. Also, clean any surfaces that may have blood, body fluids, and/or secretions or excretions on them. Wear gloves when cleaning surfaces the patient has  come in contact with. Use a diluted bleach solution (e.g., dilute bleach with 1 part bleach and 10 parts water) or a household disinfectant with a label that says EPA-registered for coronaviruses. To make a bleach solution at home, add 1 tablespoon of bleach to 1 quart (4 cups) of water. For a larger supply, add  cup of bleach to 1 gallon (16 cups) of water. Read labels of cleaning products and follow recommendations provided on product labels. Labels contain instructions for safe and effective use of the cleaning product including precautions you should take when applying the product, such as wearing gloves or eye protection and making sure you have good ventilation during use of the product. Remove gloves and wash hands immediately after cleaning.  Monitor yourself for signs and symptoms of illness Caregivers and household members are considered close contacts, should monitor their health, and will be asked to limit movement outside of the home to the extent possible. Follow the monitoring steps for close contacts listed on the symptom monitoring form.   ? If you have additional questions, contact your local health department or call the epidemiologist on call at [phone] (available 24/7). ? This guidance is subject to change. For the most up-to-date guidance from [facility], please refer to their website: [email or web address]hu

## 2021-05-04 NOTE — Progress Notes (Signed)
Virtual Visit via Telephone Note  I connected with Zachary Walker on 05/04/21 at  3:40 PM EST by telephone and verified that I am speaking with the correct person using two identifiers.  Location: Patient: home Provider: [facility]   I discussed the limitations, risks, security and privacy concerns of performing an evaluation and management service by telephone and the availability of in person appointments. I also discussed with the patient that there may be a patient responsible charge related to this service. The patient expressed understanding and agreed to proceed.   History of Present Illness:  UPPER RESPIRATORY TRACT INFECTION - symptom onset 11/26. - has not tested for COVID - was around granddaughter over the holiday with similar sx.  Fever: no Cough: yes, productive of phlegm Shortness of breath: no Chest pain: no Chest tightness: no Chest congestion: yes Nasal congestion: yes Ear pain: no  Ear pressure: no  Vomiting: no Fatigue: yes Sick contacts: yes, granddaughter Relief with OTC cold/cough medications: no  Treatments attempted:  mucinex, tussinex    Observations/Objective:  Patient had trouble connecting to video visit, entirety of visit conducted over the phone.  Speaks in full sentences, no respiratory distress.   Assessment and Plan:  COVID-19 Doing well with mild sx. Would be a candidate for COVID treatment, will test for COVID, flu. Reviewed OTC symptom relief, self-quarantine guidelines, and emergency precautions.      I discussed the assessment and treatment plan with the patient. The patient was provided an opportunity to ask questions and all were answered. The patient agreed with the plan and demonstrated an understanding of the instructions.   The patient was advised to call back or seek an in-person evaluation if the symptoms worsen or if the condition fails to improve as anticipated.  I provided 9 minutes of non-face-to-face time during this  encounter.   Caro Laroche, DO

## 2021-05-05 ENCOUNTER — Ambulatory Visit: Payer: Medicare PPO | Admitting: Nurse Practitioner

## 2021-05-06 ENCOUNTER — Ambulatory Visit: Payer: Self-pay | Admitting: *Deleted

## 2021-05-06 NOTE — Telephone Encounter (Signed)
FYI: you did video visit 11/30 pt did not come to get swabbed until late yesterday afternoon.  Test was not picked up til today.

## 2021-05-06 NOTE — Telephone Encounter (Signed)
Patient is calling for COVID result- still pending. Patient states he is coughing green sputum and is requesting antibiotic for his cough and chest congestion. Patient states he is not eating - not hungry. Patient also states he has low grade temperature. Wife is concerned about bronchitis and patient fatigue. Patient had appointment with provider 2 days ago- reports not worse- but not better. Call to office- advised provider not in office- but will forward concern to provider in office for review.Patient advised office would call him back.

## 2021-05-06 NOTE — Telephone Encounter (Signed)
Pt had virtual visit 11/30.  Calling to see if covid test back. (Its not)  pt is questioning why he did not get anything prescribed.  Pt has not eaten in 2 days.  He has congestion in his chest.  Pt thinks it may be bronchitis, and he needs abx.  Reason for Disposition . Fever present > 3 days (72 hours)  Answer Assessment - Initial Assessment Questions 1. ONSET: "When did the cough begin?"      Off/on- 2-3 days 2. SEVERITY: "How bad is the cough today?"      Not often 3. SPUTUM: "Describe the color of your sputum" (none, dry cough; clear, white, yellow, green)     green 4. HEMOPTYSIS: "Are you coughing up any blood?" If so ask: "How much?" (flecks, streaks, tablespoons, etc.)     na 5. DIFFICULTY BREATHING: "Are you having difficulty breathing?" If Yes, ask: "How bad is it?" (e.g., mild, moderate, severe)    - MILD: No SOB at rest, mild SOB with walking, speaks normally in sentences, can lie down, no retractions, pulse < 100.    - MODERATE: SOB at rest, SOB with minimal exertion and prefers to sit, cannot lie down flat, speaks in phrases, mild retractions, audible wheezing, pulse 100-120.    - SEVERE: Very SOB at rest, speaks in single words, struggling to breathe, sitting hunched forward, retractions, pulse > 120      Same- weakness, not eating 6. FEVER: "Do you have a fever?" If Yes, ask: "What is your temperature, how was it measured, and when did it start?"     Low grade  Protocols used: Cough - Acute Productive-A-AH

## 2021-05-07 ENCOUNTER — Ambulatory Visit
Admission: EM | Admit: 2021-05-07 | Discharge: 2021-05-07 | Disposition: A | Discharge status: Home / Self Care | Payer: Medicare PPO | Attending: Family | Admitting: Family

## 2021-05-07 ENCOUNTER — Ambulatory Visit (INDEPENDENT_AMBULATORY_CARE_PROVIDER_SITE_OTHER): Payer: Medicare PPO

## 2021-05-07 ENCOUNTER — Other Ambulatory Visit: Payer: Self-pay

## 2021-05-07 DIAGNOSIS — R062 Wheezing: Secondary | ICD-10-CM

## 2021-05-07 DIAGNOSIS — Z7982 Long term (current) use of aspirin: Secondary | ICD-10-CM | POA: Insufficient documentation

## 2021-05-07 DIAGNOSIS — H3530 Unspecified macular degeneration: Secondary | ICD-10-CM | POA: Insufficient documentation

## 2021-05-07 DIAGNOSIS — E785 Hyperlipidemia, unspecified: Secondary | ICD-10-CM | POA: Diagnosis not present

## 2021-05-07 DIAGNOSIS — M549 Dorsalgia, unspecified: Secondary | ICD-10-CM | POA: Diagnosis not present

## 2021-05-07 DIAGNOSIS — J069 Acute upper respiratory infection, unspecified: Secondary | ICD-10-CM | POA: Diagnosis not present

## 2021-05-07 DIAGNOSIS — Z87891 Personal history of nicotine dependence: Secondary | ICD-10-CM | POA: Insufficient documentation

## 2021-05-07 DIAGNOSIS — R051 Acute cough: Secondary | ICD-10-CM | POA: Insufficient documentation

## 2021-05-07 DIAGNOSIS — Z20822 Contact with and (suspected) exposure to covid-19: Secondary | ICD-10-CM | POA: Insufficient documentation

## 2021-05-07 DIAGNOSIS — K219 Gastro-esophageal reflux disease without esophagitis: Secondary | ICD-10-CM | POA: Insufficient documentation

## 2021-05-07 DIAGNOSIS — R059 Cough, unspecified: Secondary | ICD-10-CM | POA: Diagnosis present

## 2021-05-07 DIAGNOSIS — Z79899 Other long term (current) drug therapy: Secondary | ICD-10-CM | POA: Diagnosis not present

## 2021-05-07 DIAGNOSIS — R531 Weakness: Secondary | ICD-10-CM | POA: Insufficient documentation

## 2021-05-07 LAB — RESP PANEL BY RT-PCR (FLU A&B, COVID) ARPGX2
Influenza A by PCR: NEGATIVE
Influenza B by PCR: NEGATIVE
SARS Coronavirus 2 by RT PCR: NEGATIVE

## 2021-05-07 LAB — NOVEL CORONAVIRUS, NAA: SARS-CoV-2, NAA: NOT DETECTED

## 2021-05-07 LAB — SPECIMEN STATUS REPORT

## 2021-05-07 MED ORDER — AZITHROMYCIN 250 MG PO TABS: 250.0000 mg | ORAL_TABLET | Freq: Every day | ORAL | 0 refills | Status: AC

## 2021-05-07 MED ORDER — PREDNISONE 10 MG PO TABS
30.0000 mg | ORAL_TABLET | Freq: Every day | ORAL | 0 refills | Status: AC
Start: 2021-05-07 — End: 2021-05-12

## 2021-05-07 MED ORDER — BENZONATATE 100 MG PO CAPS: 200.0000 mg | ORAL_CAPSULE | Freq: Three times a day (TID) | ORAL | 0 refills | Status: AC | PRN

## 2021-05-07 NOTE — ED Provider Notes (Signed)
[facility]    CSN: [id number] Arrival date & time: 05/07/21  0809      History   Chief Complaint Chief Complaint  Patient presents with   Cough   Headache   Fatigue         HPI BEAUX RAHLF is a 80 y.o. male.   80 year old male accompanied by his wife who is also here to be seen for similar symptoms presents with nasal congestion, body aches, headache and cough for over 1 week. Continues to have more chest congestion and more difficulty breathing. Also developed a low grade fever yesterday of 99. Has felt weak and decreased appetite but no nausea, vomiting or diarrhea. He has taken Tylenol, Mucinex and OTC nighttime cold medication with minimal relief. Former smoker. Concerned over cough and increased work of breathing. Other chronic health issues include hyperlipidemia, GERD, back pain and macular degeneration. Currently on Lipitor, aspirin and MVI daily.   The history is provided by the patient and the spouse.   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 Authorizing Provider   acetaminophen (TYLENOL) 650 MG CR tablet Take 1,300 mg by mouth every 12 (twelve) hours as needed for pain.   Yes [provider]  aspirin EC 81 MG tablet Take 1 tablet (81 mg total) by mouth daily. 08/22/18  Yes Lada, Satira Anis, MD  atorvastatin (LIPITOR) 20 MG tablet TAKE 1 TABLET BY MOUTH EVERYDAY AT BEDTIME 11/02/20  Yes Delsa Grana, PA-C  azithromycin (ZITHROMAX) 250 MG tablet Take 1 tablet (250 mg total) by mouth daily. Take first 2 tablets together, then 1 every day until finished. 05/07/21  Yes Ezekeil Bethel, Nicholes Stairs, NP  benzonatate (TESSALON PERLES) 100 MG capsule Take 2 capsules (200 mg total) by mouth 3 (three) times daily as needed for cough. 05/07/21  Yes Jerry Haugen, Nicholes Stairs, NP  Multiple Vitamin (MULTIVITAMIN) tablet Take 1 tablet by mouth daily.   Yes [provider]  Multiple Vitamins-Minerals (PRESERVISION AREDS) CAPS One by mouth twice a day 03/04/18  Yes Lada, Satira Anis, MD  predniSONE (DELTASONE) 10 MG tablet Take 3 tablets (30 mg total) by mouth daily with breakfast for 5 days. 05/07/21 05/12/21 Yes Keyira Mondesir, Nicholes Stairs, NP    Family History Family History  Problem Relation Age of Onset   Macular degeneration Mother    Emphysema Mother    Heart disease Brother     Social History Social History   Tobacco Use   Smoking status: Former    Types: Cigarettes    Quit date: 1978    Years since quitting: 67.9  Smokeless tobacco: Former    Types: Chew    Quit date: 1978  Vaping Use   Vaping Use: Never used  Substance Use Topics   Alcohol use: Yes    Alcohol/week: 1.0 standard drink    Types: 1 Cans of beer per week    Comment: 5 a month   Drug use: Never     Allergies   Patient has no known allergies.   Review of Systems Review of Systems  Constitutional:  Positive for activity change, appetite change, chills, fatigue and fever. Negative for diaphoresis.  HENT:  Positive for congestion, postnasal drip, sinus pressure and sore throat (irritated). Negative  for ear discharge, ear pain, facial swelling, mouth sores, nosebleeds, sinus pain and trouble swallowing.   Eyes:  Negative for discharge, redness and itching.  Respiratory:  Positive for cough, chest tightness and shortness of breath. Negative for stridor.   Gastrointestinal:  Negative for diarrhea, nausea and vomiting.  Musculoskeletal:  Positive for arthralgias and myalgias. Negative for neck pain and neck stiffness.  Skin:  Negative for color change and rash.  Allergic/Immunologic: Negative for environmental allergies and food allergies.  Neurological:  Positive for weakness, light-headedness and headaches. Negative for dizziness, tremors, seizures and syncope.  Hematological:  Negative for adenopathy. Bruises/bleeds easily.    Physical Exam Triage Vital Signs ED Triage Vitals  Enc Vitals Group     BP 05/07/21 0830 (!) 128/107     Pulse Rate 05/07/21 0830 (!) 124     Resp 05/07/21 0830 18     Temp 05/07/21 0830 98 F (36.7 C)     Temp Source 05/07/21 0830 Oral     SpO2 05/07/21 0830 93 %     Weight 05/07/21 0824 155 lb (70.3 kg)     Height 05/07/21 0824 5\' 7"  (1.702 m)     Head Circumference --      Peak Flow --      Pain Score 05/07/21 0823 7     Pain Loc --      Pain Edu? --      Excl. in [place]? --    No data found.  Updated Vital Signs BP (!) 128/107 (BP Location: Left Arm)   Pulse (!) 124   Temp 98 F (36.7 C) (Oral)   Resp 18   Ht 5\' 7"  (1.702 m)   Wt 155 lb (70.3 kg)   SpO2 93%   BMI 24.28 kg/m   Visual Acuity Right Eye Distance:   Left Eye Distance:   Bilateral Distance:    Right Eye Near:   Left Eye Near:    Bilateral Near:     Physical Exam Vitals and nursing note reviewed.  Constitutional:      General: He is awake. He is not in acute distress.    Appearance: He is well-developed and underweight. He is ill-appearing.     Comments: He is sitting on the exam table in no acute distress but appears to be breathing quickly with accessory muscle use.    HENT:     Head: Normocephalic and atraumatic.     Right Ear: Tympanic membrane, ear canal and external ear normal. Decreased hearing noted. Tympanic membrane is not injected, retracted or bulging.     Left Ear: Tympanic membrane, ear canal and external ear normal. Decreased hearing noted. Tympanic membrane is not injected, retracted or bulging.     Nose: Congestion present.     Right Sinus: No maxillary sinus tenderness or frontal sinus tenderness.  Left Sinus: No maxillary sinus tenderness or frontal sinus tenderness.     Mouth/Throat:     Lips: Pink.     Mouth: Mucous membranes are dry.     Dentition: Abnormal dentition. Dental caries present.     Pharynx: Uvula midline. Posterior oropharyngeal erythema present. No pharyngeal swelling, oropharyngeal exudate or uvula swelling.  Eyes:     Extraocular Movements: Extraocular movements intact.     Conjunctiva/sclera: Conjunctivae normal.  Cardiovascular:     Rate and Rhythm: Regular rhythm. Tachycardia present.     Heart sounds: Normal heart sounds. No murmur heard. Pulmonary:     Effort: Tachypnea and accessory muscle usage present. No respiratory distress or retractions.     Breath sounds: Decreased air movement present. Examination of the right-upper field reveals decreased breath sounds and wheezing. Examination of the left-upper field reveals decreased breath sounds and wheezing. Examination of the right-middle field reveals decreased breath sounds. Examination of the right-lower field reveals decreased breath sounds. Examination of the left-lower field reveals decreased breath sounds. Decreased breath sounds and wheezing present. No rhonchi or rales.     Comments: Slight decreased breath sounds in all lung fields but especially at the bases. Mild wheezes present.  Musculoskeletal:     Cervical back: Normal range of motion and neck supple.  Lymphadenopathy:     Cervical: No cervical adenopathy.  Skin:    General: Skin is warm and  dry.     Capillary Refill: Capillary refill takes less than 2 seconds.     Findings: No rash.  Neurological:     General: No focal deficit present.     Mental Status: He is alert and oriented to person, place, and time.  Psychiatric:        Mood and Affect: Mood normal.        Behavior: Behavior normal. Behavior is cooperative.        Thought Content: Thought content normal.        Judgment: Judgment normal.     UC Treatments / Results  Labs (all labs ordered are listed, but only abnormal results are displayed) Labs Reviewed  RESP PANEL BY RT-PCR (FLU A&B, COVID) [id number]    EKG   Radiology DG Chest 2 View  Result Date: 05/07/2021 CLINICAL DATA:  80 year old male with cough, headache, congestion, body ache. EXAM: CHEST - 2 VIEW COMPARISON:  CTA neck 05/20/2012. FINDINGS: Large lung volumes. Normal cardiac size and mediastinal contours. Visualized tracheal air column is within normal limits. Upper lungs are clear. No pneumothorax or pleural effusion. But there is abnormal increased reticulonodular opacity at both lung bases, probably in the lower lobes given the lateral appearance. No pleural effusion. No consolidation identified. No acute osseous abnormality identified. Flowing endplate osteophytes in the thoracic spine. Negative visible bowel gas. IMPRESSION: Pulmonary hyperinflation with abnormal lower lobe reticulonodular opacity. Consider acute viral/atypical respiratory infection. No pleural effusion. Electronically Signed   By: Genevie Averi Kilty M.D.   On: 05/07/2021 09:35    Procedures Procedures (including critical care time)  Medications Ordered in UC Medications - No data to display  Initial Impression / Assessment and Plan / UC Course  I have reviewed the triage vital signs and the nursing notes. Pertinent labs & imaging results that were available during my care of the patient were reviewed by me and considered in my medical decision making (see chart for details).      Reviewed negative rapid Influenza and COVID test results with patient and wife. Reviewed chest  x-ray results with patient- abnormal lower lobe reticulonodular opacity that may be atypical infection. No distinct pneumonia. Consulted with Dr. Tracie Harrier. Although patient is tachycardic and has increased work of breathing and his O2 Sat is borderline at 93%, patient appears stable and requests outpatient treatment at this time with strict guidelines regarding going to the ER if symptoms worsen. Due to x-ray and clinical findings for possible atypical infection, will start Zithromax as directed. May also take Prednisone 30mg  daily for 5 days to help with inflammation. May use Tessalon cough pills 200mg  every 8 hours as needed. Continue to push fluids to help loosen up mucus in chest. Rest. May take Tylenol 650mg  every 6 to 8 hours as needed for fever or body aches. If any increase in difficulty breathing occurs, worsening of cough, increase in fever or disorientation occurs, go to the ER ASAP. Otherwise, follow-up with his PCP in 3 to 4 days for recheck.  Final Clinical Impressions(s) / UC Diagnoses   Final diagnoses:  Acute cough  Wheezing  Acute upper respiratory infection     Discharge Instructions      Recommend start Zithromax 2 tablets today then 1 tablet daily for 5 days. Start Prednisone 10mg  tablets- take 3 tablets daily for 5 days then stop. May use Tessalon cough pills 2 every 8 hours as needed for cough. Continue to push fluids to help loosen up mucus in chest. Rest. If more difficulty breathing occurs, worsening of cough, any disorientation or increase in fever occurs, go to the ER ASAP. Otherwise, follow-up with your PCP in 3 to 4 days for recheck.      ED Prescriptions     Medication Sig Dispense Auth. Provider   azithromycin (ZITHROMAX) 250 MG tablet Take 1 tablet (250 mg total) by mouth daily. Take first 2 tablets together, then 1 every day until finished. 6 tablet ,  NP   predniSONE (DELTASONE) 10 MG tablet Take 3 tablets (30 mg total) by mouth daily with breakfast for 5 days. 15 tablet Henson Fraticelli, , NP   benzonatate (TESSALON PERLES) 100 MG capsule Take 2 capsules (200 mg total) by mouth 3 (three) times daily as needed for cough. 30 capsule Efton Thomley, , NP      PDMP not reviewed this encounter.   , NP 05/08/21 1815

## 2021-05-07 NOTE — ED Triage Notes (Signed)
Pt c/o cough, headaches, congestion, body aches, fatigue since 04/29/21

## 2021-05-07 NOTE — Discharge Instructions (Addendum)
Recommend start Zithromax 2 tablets today then 1 tablet daily for 5 days. Start Prednisone 10mg  tablets- take 3 tablets daily for 5 days then stop. May use Tessalon cough pills 2 every 8 hours as needed for cough. Continue to push fluids to help loosen up mucus in chest. Rest. If more difficulty breathing occurs, worsening of cough, any disorientation or increase in fever occurs, go to the ER ASAP. Otherwise, follow-up with your PCP in 3 to 4 days for recheck.

## 2021-05-09 NOTE — Telephone Encounter (Signed)
Looks like he went to urgent care, and they gave antibiotic

## 2021-06-17 ENCOUNTER — Ambulatory Visit: Payer: Medicare PPO | Admitting: Family Medicine

## 2021-09-02 ENCOUNTER — Telehealth: Payer: Self-pay | Admitting: Family Medicine

## 2021-09-02 NOTE — Telephone Encounter (Signed)
Pt states he will schedule an appt when he gets back from [place] ?

## 2021-10-03 ENCOUNTER — Telehealth: Payer: Self-pay | Admitting: Family Medicine

## 2021-10-03 NOTE — Telephone Encounter (Signed)
Appt canceled. Pt aware appt canceled. PCP removed. Please update attribution list. Thank you.  ?

## 2021-10-03 NOTE — Telephone Encounter (Signed)
Pt is calling to cancel his AWV. Pt is no longer a patient of this office. Pt now goes to the [place]. Please advise  ?

## 2021-10-06 ENCOUNTER — Ambulatory Visit: Payer: Medicare PPO

## 2021-12-09 ENCOUNTER — Other Ambulatory Visit: Payer: Self-pay | Admitting: Family Medicine

## 2021-12-26 ENCOUNTER — Other Ambulatory Visit: Payer: Self-pay | Admitting: Family Medicine

## 2022-01-23 IMAGING — CR DG CHEST 2V
3 series · 3 of 3 positions shown · non-contrast
Comparison: CTA neck 05/20/2012.

CLINICAL DATA: 80-year-old male with cough, headache, congestion,
body ache.

EXAM:
CHEST - 2 VIEW

[chest pa]
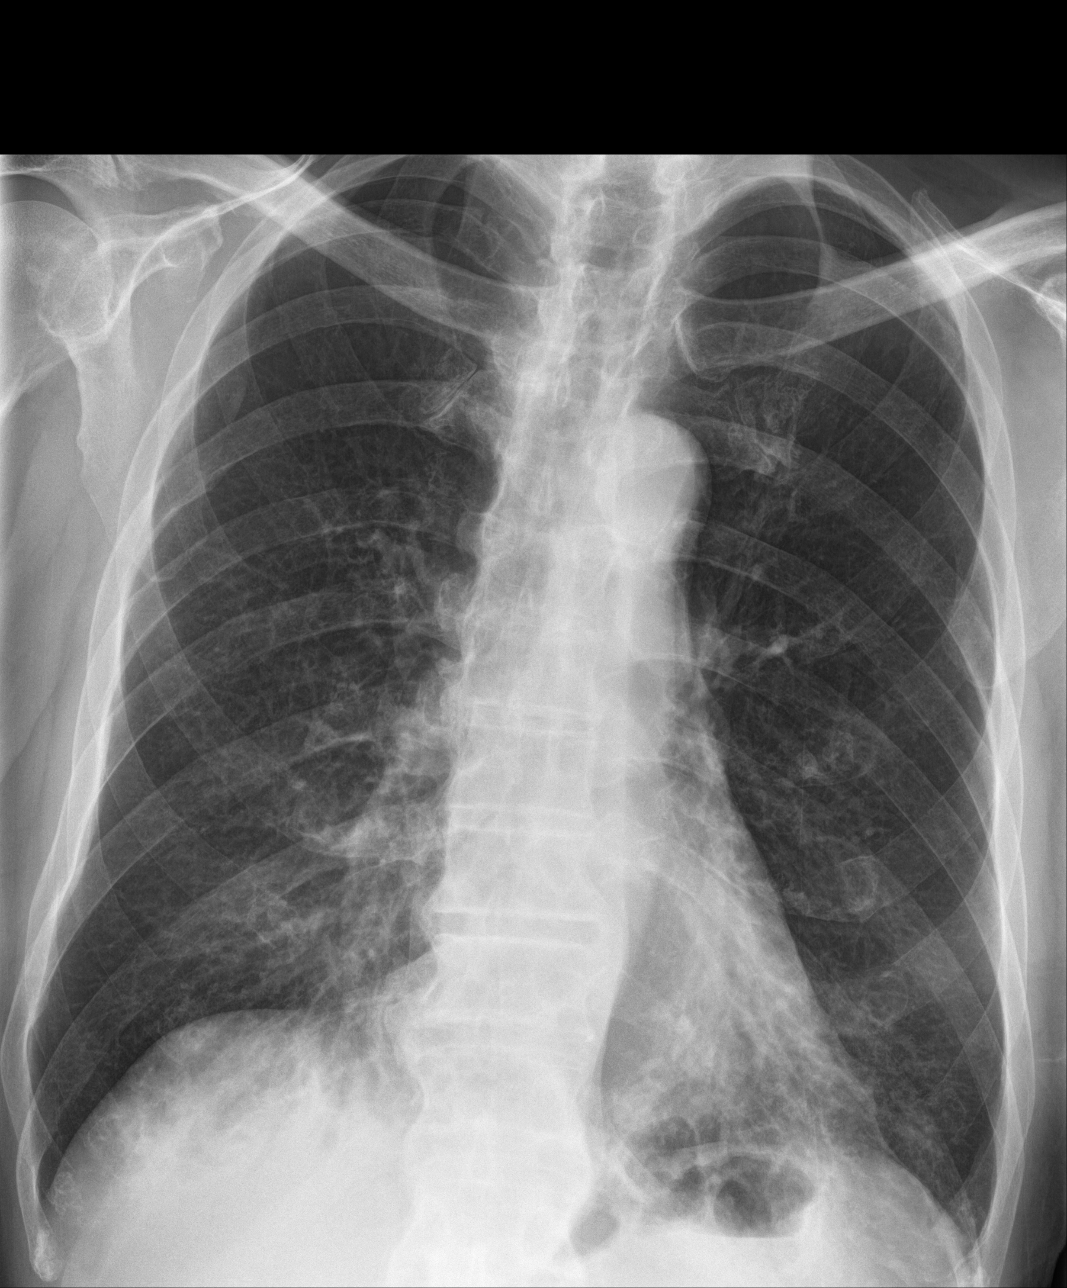

[chest lat]
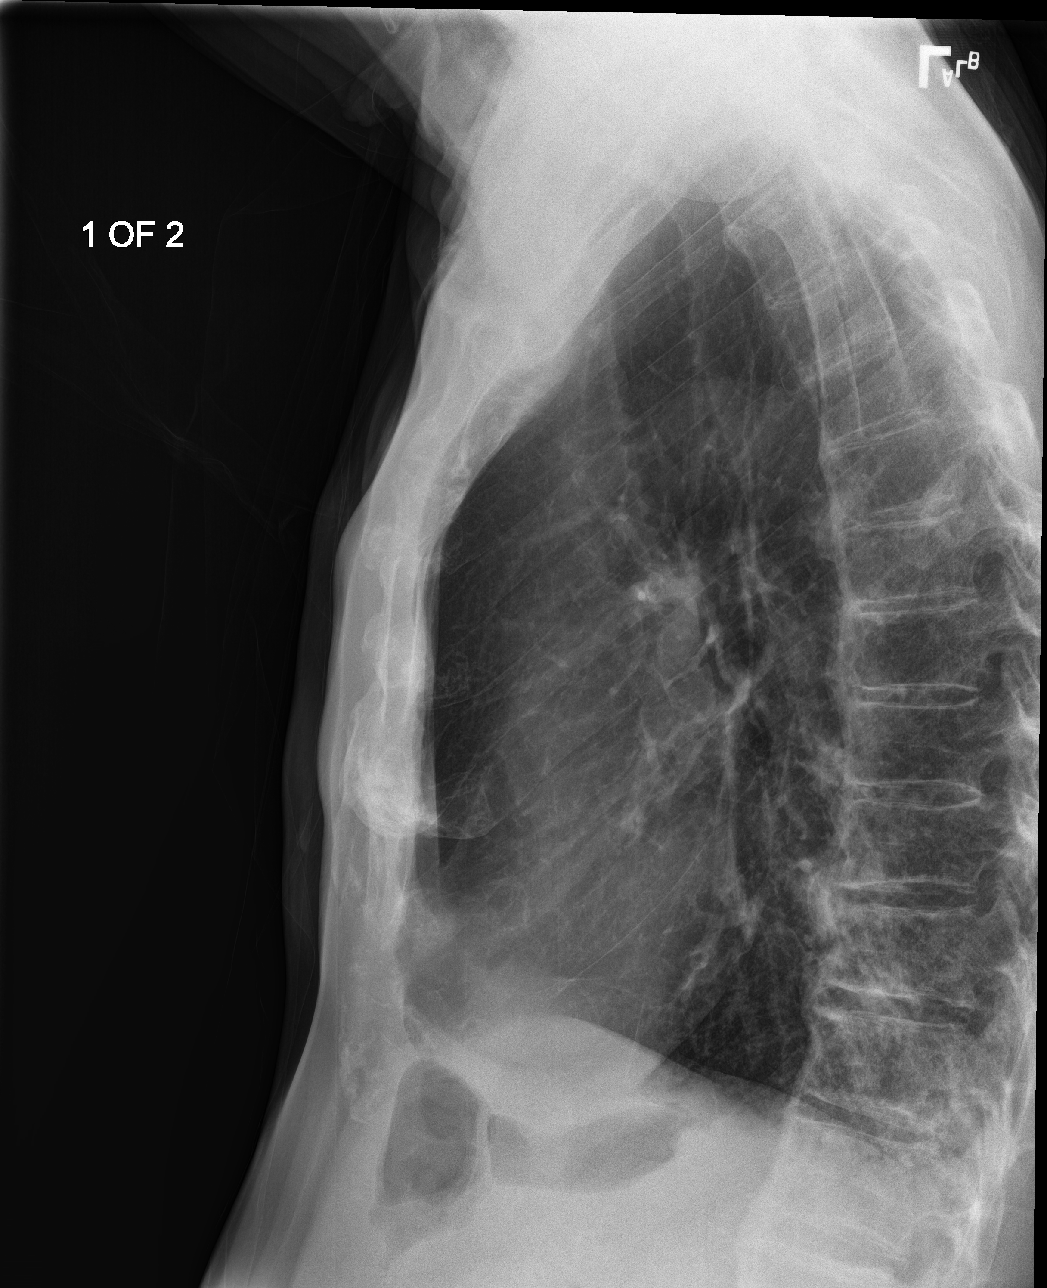

[chest ap]
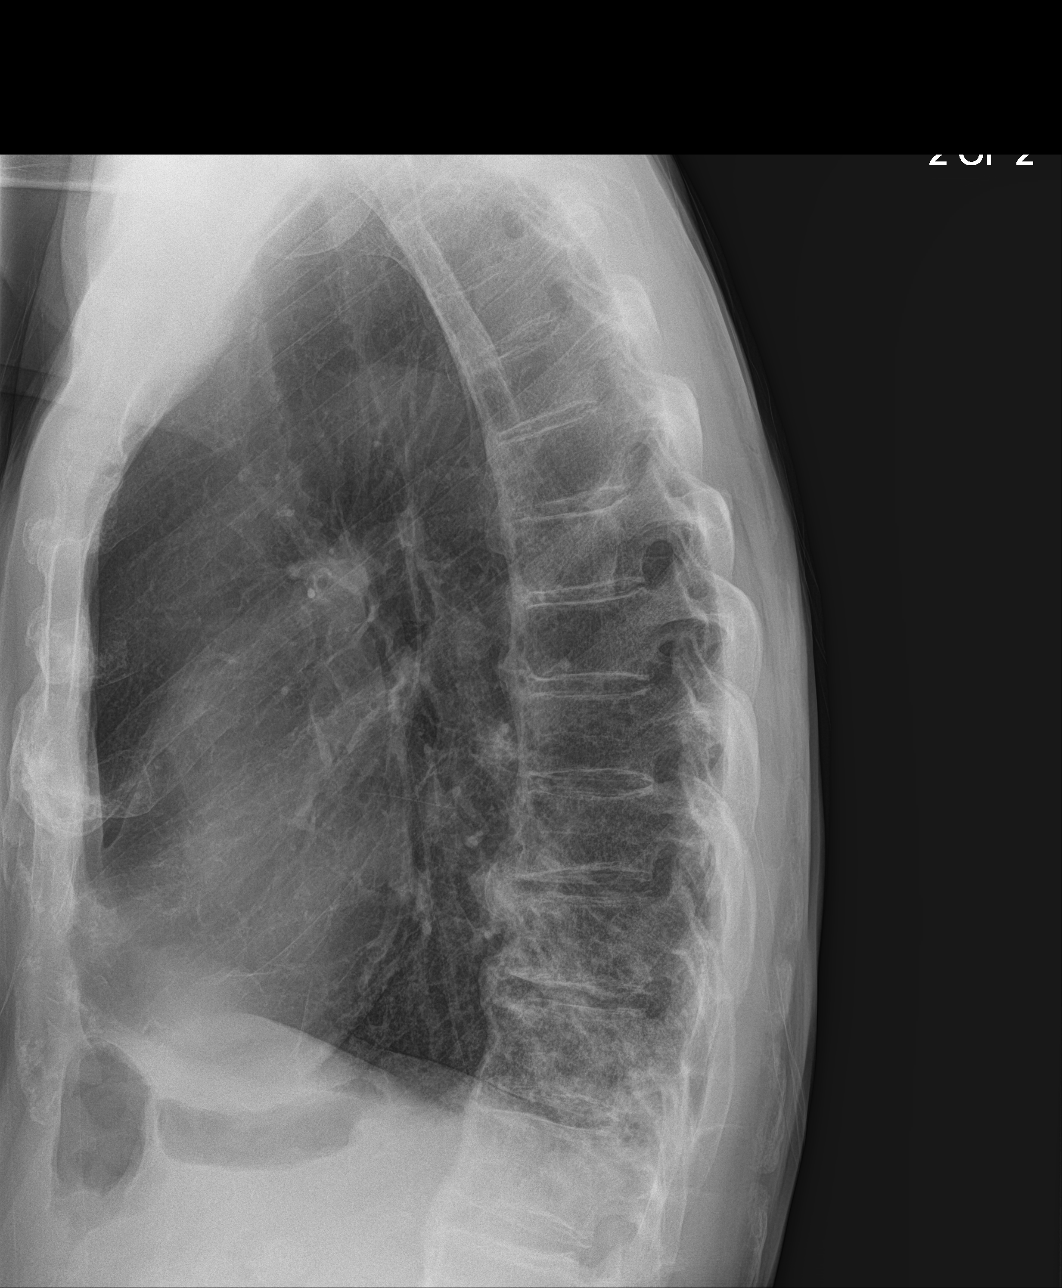

[3 of 3 positions shown; findings below may reference images not displayed]

FINDINGS: Large lung volumes. Normal cardiac size and mediastinal contours.
Visualized tracheal air column is within normal limits. Upper lungs
are clear. No pneumothorax or pleural effusion. But there is
abnormal increased reticulonodular opacity at both lung bases,
probably in the lower lobes given the lateral appearance. No pleural
effusion. No consolidation identified.

No acute osseous abnormality identified. Flowing endplate
osteophytes in the thoracic spine. Negative visible bowel gas.
IMPRESSION: Pulmonary hyperinflation with abnormal lower lobe reticulonodular
opacity. Consider acute viral/atypical respiratory infection. No
pleural effusion.
